# Patient Record
Sex: Female | Born: 1985
Health system: Southern US, Community
[De-identification: ages and names within clinical notes are randomized; demographics above are authoritative.]

## PROBLEM LIST (undated history)

## (undated) DIAGNOSIS — Z789 Other specified health status: Secondary | ICD-10-CM

## (undated) HISTORY — PX: WISDOM TOOTH EXTRACTION: SHX21

---

## 2016-07-20 DIAGNOSIS — H5203 Hypermetropia, bilateral: Secondary | ICD-10-CM | POA: Diagnosis not present

## 2016-10-18 ENCOUNTER — Other Ambulatory Visit (HOSPITAL_COMMUNITY)
Admission: RE | Admit: 2016-10-18 | Discharge: 2016-10-18 | Disposition: A | Discharge status: Home / Self Care | Payer: 59 | Source: Ambulatory Visit | Attending: Family Medicine | Admitting: Family Medicine

## 2016-10-18 ENCOUNTER — Other Ambulatory Visit: Payer: Self-pay | Admitting: Family Medicine

## 2016-10-18 DIAGNOSIS — Z1151 Encounter for screening for human papillomavirus (HPV): Secondary | ICD-10-CM | POA: Insufficient documentation

## 2016-10-18 DIAGNOSIS — Z131 Encounter for screening for diabetes mellitus: Secondary | ICD-10-CM | POA: Diagnosis not present

## 2016-10-18 DIAGNOSIS — Z0000 Encounter for general adult medical examination without abnormal findings: Secondary | ICD-10-CM | POA: Diagnosis not present

## 2016-10-18 DIAGNOSIS — Z124 Encounter for screening for malignant neoplasm of cervix: Secondary | ICD-10-CM | POA: Insufficient documentation

## 2016-10-18 DIAGNOSIS — Z13220 Encounter for screening for lipoid disorders: Secondary | ICD-10-CM | POA: Diagnosis not present

## 2016-10-25 LAB — CYTOLOGY - PAP
DIAGNOSIS: NEGATIVE
HPV (WINDOPATH): NOT DETECTED

## 2017-02-07 MED FILL — AZITHROMYCIN 500 MG TABLET: 500 | 3 days supply | Qty: 3 | Fill #0

## 2017-02-07 MED FILL — ATOVAQUONE-PROGUANIL 250-10: 250-100 | 15 days supply | Qty: 15 | Fill #0

## 2017-03-03 DIAGNOSIS — Z30432 Encounter for removal of intrauterine contraceptive device: Secondary | ICD-10-CM | POA: Diagnosis not present

## 2017-03-03 DIAGNOSIS — Z3009 Encounter for other general counseling and advice on contraception: Secondary | ICD-10-CM | POA: Diagnosis not present

## 2017-06-26 DIAGNOSIS — H5203 Hypermetropia, bilateral: Secondary | ICD-10-CM | POA: Diagnosis not present

## 2017-10-13 DIAGNOSIS — Z3401 Encounter for supervision of normal first pregnancy, first trimester: Secondary | ICD-10-CM | POA: Diagnosis not present

## 2017-10-13 DIAGNOSIS — Z3400 Encounter for supervision of normal first pregnancy, unspecified trimester: Secondary | ICD-10-CM | POA: Diagnosis not present

## 2017-10-16 LAB — OB RESULTS CONSOLE HIV ANTIBODY (ROUTINE TESTING): HIV: NONREACTIVE

## 2017-10-16 LAB — OB RESULTS CONSOLE GC/CHLAMYDIA
Chlamydia: NEGATIVE
Gonorrhea: NEGATIVE

## 2017-10-16 LAB — OB RESULTS CONSOLE HEPATITIS B SURFACE ANTIGEN: Hepatitis B Surface Ag: NEGATIVE

## 2017-10-16 LAB — OB RESULTS CONSOLE ABO/RH: RH Type: POSITIVE

## 2017-10-16 LAB — OB RESULTS CONSOLE ANTIBODY SCREEN: ANTIBODY SCREEN: NEGATIVE

## 2017-10-16 LAB — OB RESULTS CONSOLE RPR: RPR: NONREACTIVE

## 2017-10-16 LAB — OB RESULTS CONSOLE RUBELLA ANTIBODY, IGM: RUBELLA: IMMUNE

## 2017-11-23 DIAGNOSIS — Z3402 Encounter for supervision of normal first pregnancy, second trimester: Secondary | ICD-10-CM | POA: Diagnosis not present

## 2018-01-01 DIAGNOSIS — Z3400 Encounter for supervision of normal first pregnancy, unspecified trimester: Secondary | ICD-10-CM | POA: Diagnosis not present

## 2018-01-01 DIAGNOSIS — Z3402 Encounter for supervision of normal first pregnancy, second trimester: Secondary | ICD-10-CM | POA: Diagnosis not present

## 2018-01-04 ENCOUNTER — Other Ambulatory Visit (HOSPITAL_COMMUNITY): Payer: Self-pay | Admitting: Obstetrics & Gynecology

## 2018-01-04 DIAGNOSIS — Z3A19 19 weeks gestation of pregnancy: Secondary | ICD-10-CM

## 2018-01-04 DIAGNOSIS — O280 Abnormal hematological finding on antenatal screening of mother: Secondary | ICD-10-CM

## 2018-01-04 DIAGNOSIS — Z3689 Encounter for other specified antenatal screening: Secondary | ICD-10-CM

## 2018-01-09 ENCOUNTER — Encounter (HOSPITAL_COMMUNITY): Payer: Self-pay | Admitting: *Deleted

## 2018-01-10 ENCOUNTER — Encounter (HOSPITAL_COMMUNITY): Payer: Self-pay | Admitting: *Deleted

## 2018-01-11 ENCOUNTER — Ambulatory Visit (HOSPITAL_COMMUNITY)
Admission: RE | Admit: 2018-01-11 | Discharge: 2018-01-11 | Disposition: A | Discharge status: Home / Self Care | Payer: 59 | Source: Ambulatory Visit | Attending: Obstetrics & Gynecology | Admitting: Obstetrics & Gynecology

## 2018-01-11 ENCOUNTER — Encounter (HOSPITAL_COMMUNITY): Payer: Self-pay

## 2018-01-11 ENCOUNTER — Other Ambulatory Visit (HOSPITAL_COMMUNITY): Payer: Self-pay | Admitting: *Deleted

## 2018-01-11 DIAGNOSIS — O280 Abnormal hematological finding on antenatal screening of mother: Secondary | ICD-10-CM

## 2018-01-11 DIAGNOSIS — Z315 Encounter for genetic counseling: Secondary | ICD-10-CM | POA: Insufficient documentation

## 2018-01-11 DIAGNOSIS — Z3A19 19 weeks gestation of pregnancy: Secondary | ICD-10-CM | POA: Diagnosis not present

## 2018-01-11 DIAGNOSIS — O285 Abnormal chromosomal and genetic finding on antenatal screening of mother: Secondary | ICD-10-CM | POA: Diagnosis not present

## 2018-01-11 DIAGNOSIS — O289 Unspecified abnormal findings on antenatal screening of mother: Secondary | ICD-10-CM | POA: Insufficient documentation

## 2018-01-11 DIAGNOSIS — Z3689 Encounter for other specified antenatal screening: Secondary | ICD-10-CM | POA: Insufficient documentation

## 2018-01-11 DIAGNOSIS — IMO0001 Reserved for inherently not codable concepts without codable children: Secondary | ICD-10-CM

## 2018-01-11 HISTORY — DX: Other specified health status: Z78.9

## 2018-01-11 IMAGING — US US MFM OB DETAIL+14 WK
1 series · 14 of 28 positions shown · non-contrast
Comparison: none

[Series 1: us mfm ob detail+14 wk · 14 of 91 slices shown]
[im 4/91]
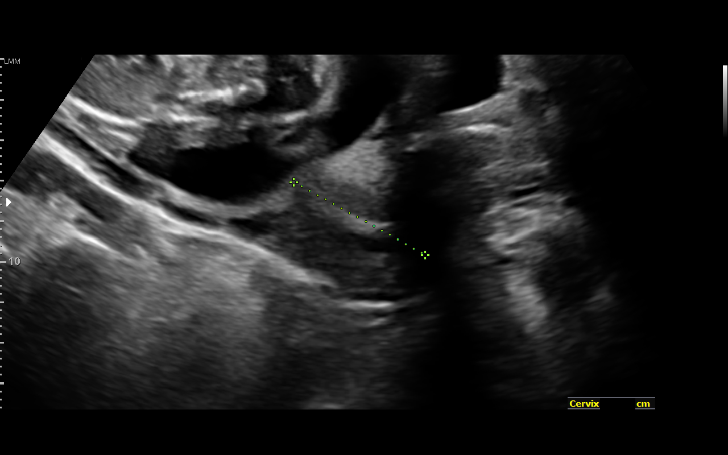
[im 11/91]
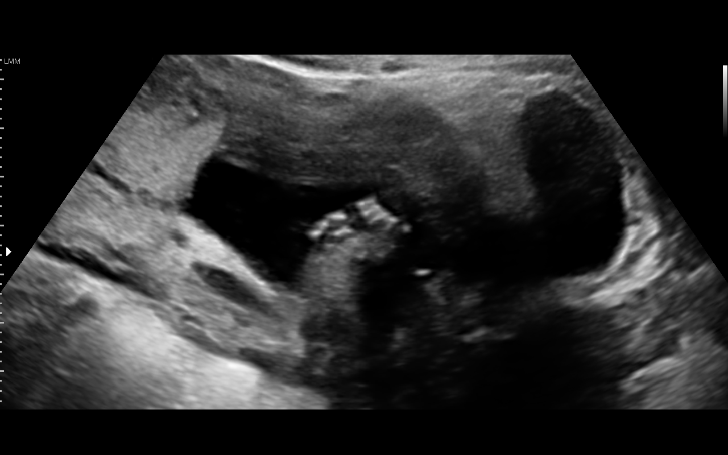
[im 17/91]
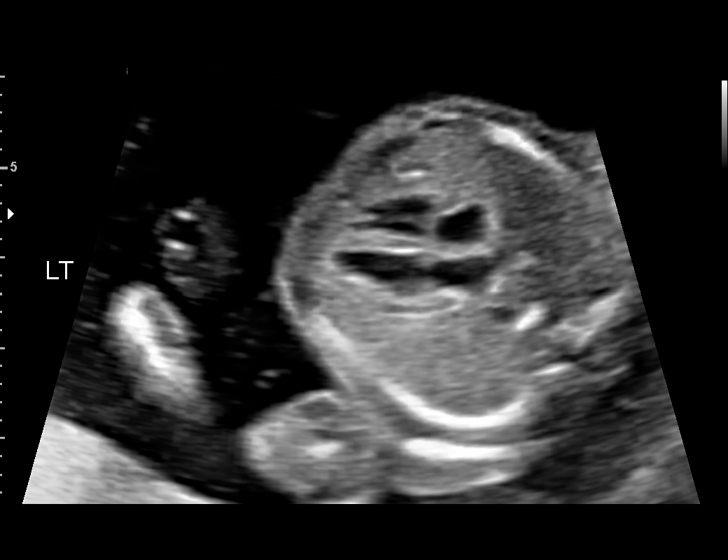
[im 24/91]
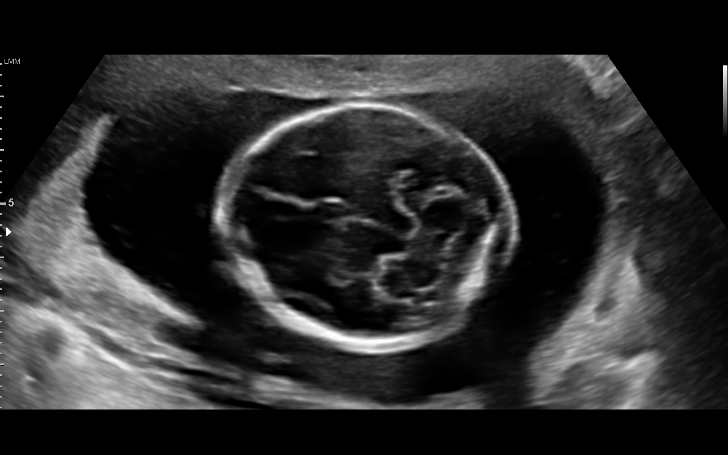
[im 31/91]
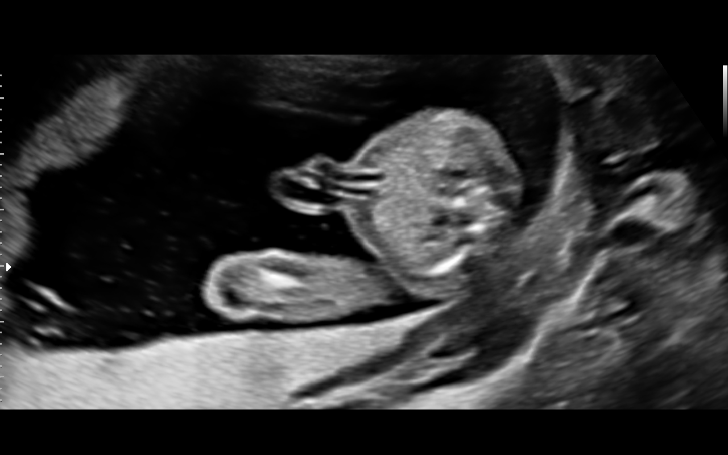
[im 37/91]
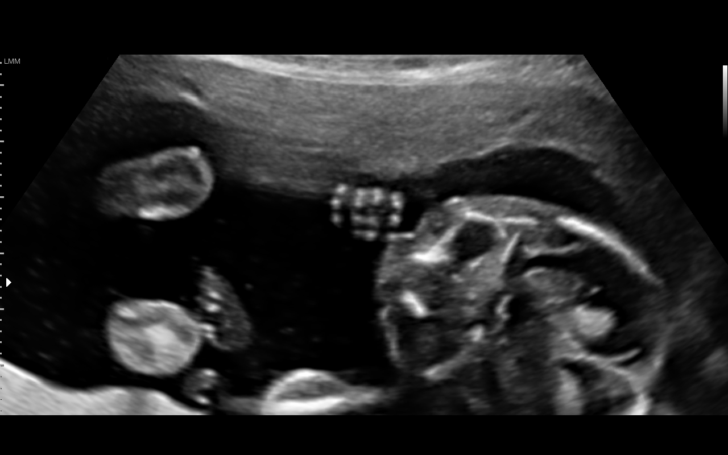
[im 44/91]
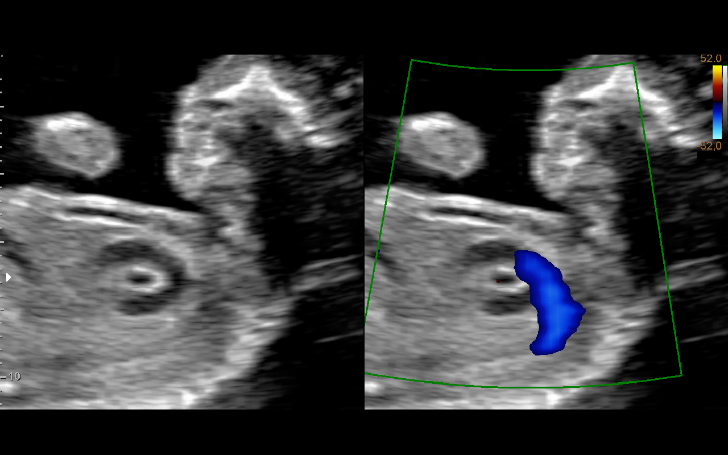
[im 51/91]
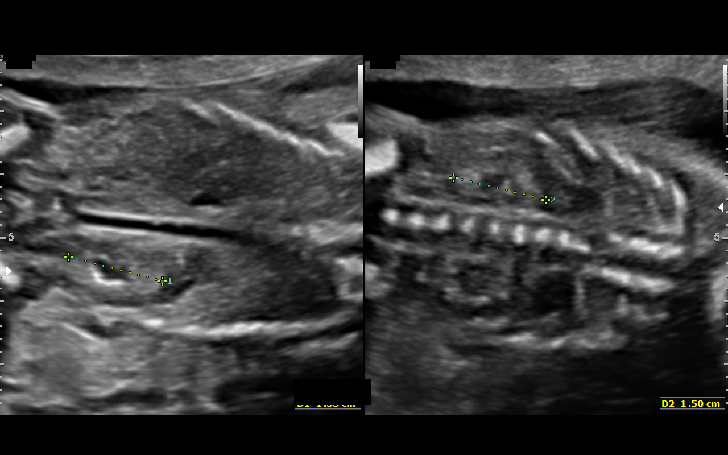
[im 57/91]
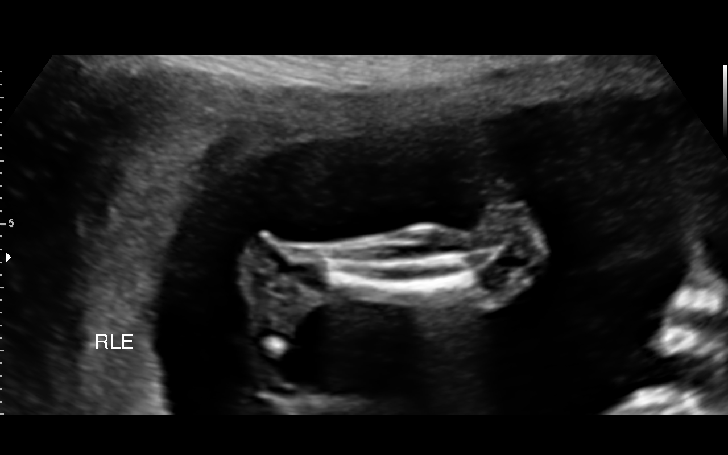
[im 64/91]
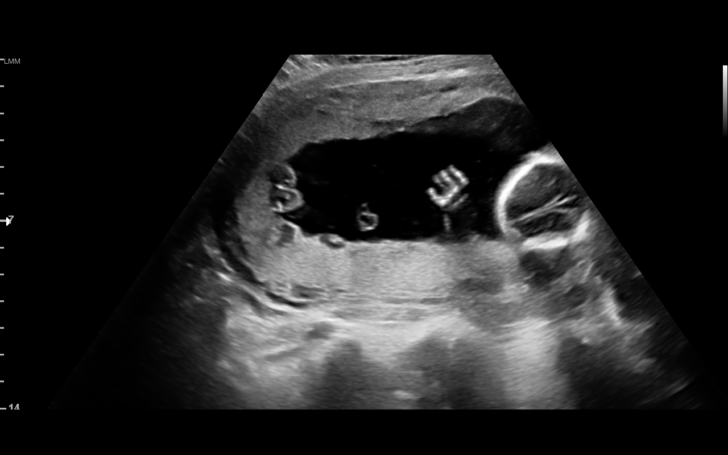
[im 71/91]
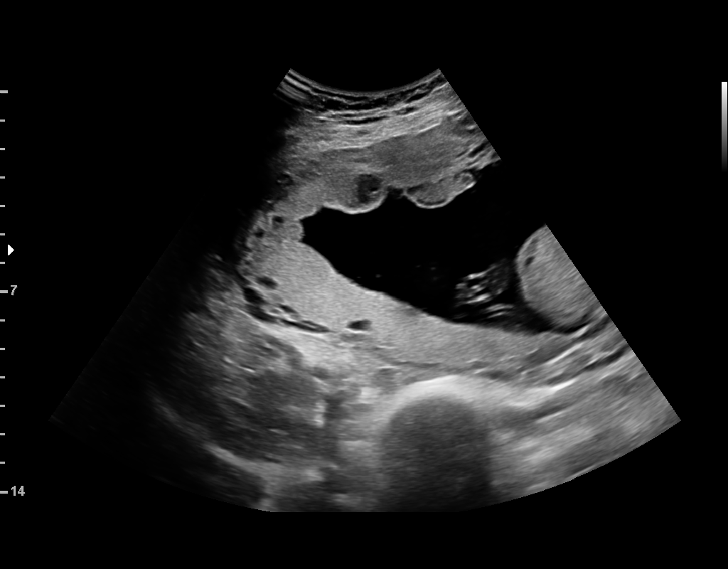
[im 77/91]
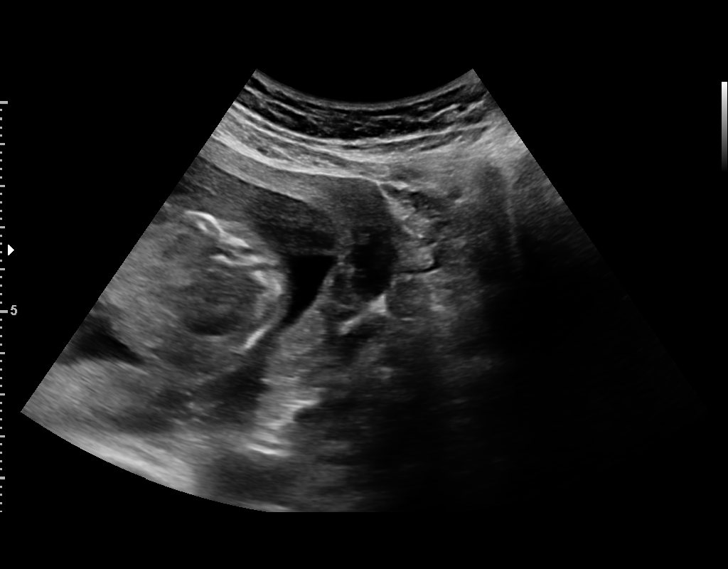
[im 84/91]
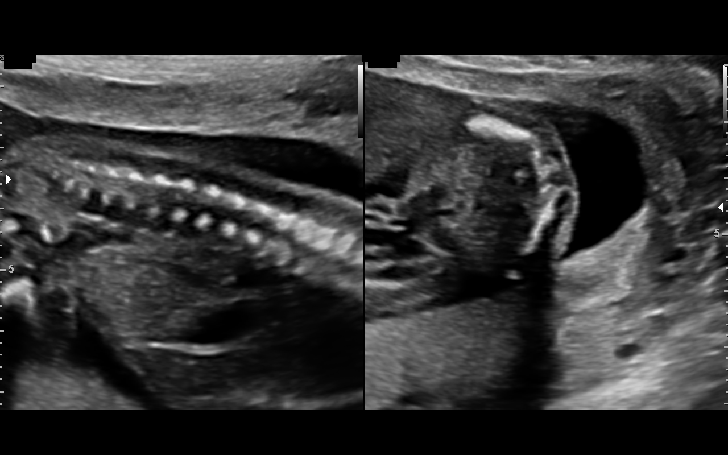
[im 91/91]
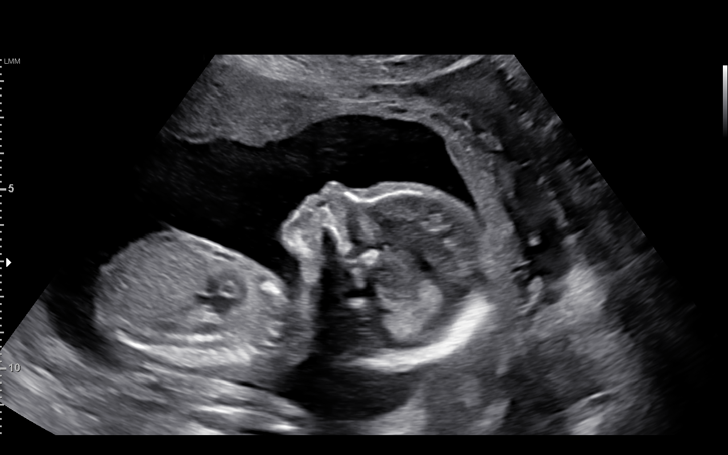

[14 of 28 positions shown; findings below may reference images not displayed]

Indications

19 weeks gestation of pregnancy
Encounter for fetal anatomic survey            [BJ]
Abnormal biochemical screen (quad) for         [BJ]
Trisomy 21 ([DATE])
OB History

Gravidity:    1
Fetal Evaluation

Num Of Fetuses:     1
Fetal Heart         155
Rate(bpm):
Cardiac Activity:   Observed
Presentation:       Cephalic
Placenta:           Posterior Fundal right,above cervical os
P. Cord Insertion:  Visualized, central

Amniotic Fluid
AFI FV:      Subjectively within normal limits

Largest Pocket(cm)
4.82
Biometry

BPD:      43.6  mm     G. Age:  19w 1d         59  %    CI:        80.87   %    70 - 86
FL/HC:      16.9   %    16.1 -
HC:      153.1  mm     G. Age:  18w 2d         14  %    HC/AC:      1.13        1.09 -
AC:      135.7  mm     G. Age:  19w 0d         47  %    FL/BPD:     59.2   %
FL:       25.8  mm     G. Age:  17w 6d         10  %    FL/AC:      19.0   %    20 - 24
CER:      18.2  mm     G. Age:  18w 1d         23  %
NFT:       3.9  mm

CM:        4.1  mm

Est. FW:     241  gm      0 lb 9 oz     34  %
Gestational Age

LMP:           19w 0d        Date:  [DATE]                 EDD:   [DATE]
U/S Today:     18w 4d                                        EDD:   [DATE]
Best:          19w 0d     Det. By:  LMP  ([DATE])          EDD:   [DATE]
Anatomy

Cranium:               Appears normal         LVOT:                   Appears normal
Cavum:                 Appears normal         Aortic Arch:            Appears normal
Ventricles:            Appears normal         Ductal Arch:            Appears normal
Choroid Plexus:        Left choroid           Diaphragm:              Appears normal
plexus cyst, 3.2mm
Cerebellum:            Appears normal         Stomach:                Appears normal, left
sided
Posterior Fossa:       Appears normal         Abdomen:                Appears normal
Nuchal Fold:           Appears normal         Abdominal Wall:         Appears nml (cord
insert, abd wall)
Face:                  Appears normal         Cord Vessels:           Appears normal (3
(orbits and profile)                           vessel cord)
Lips:                  Appears normal         Kidneys:                Appear normal
Palate:                Appears normal         Bladder:                Appears normal
Thoracic:              Appears normal         Spine:                  Appears normal
Heart:                 Appears normal         Upper Extremities:      Appears normal
(4CH, axis, and situs
RVOT:                  Appears normal         Lower Extremities:      Appears normal

Other:  Fetus appears to be a female. Heels and 5th digit appears normal.
Nasal bone visualized.
Cervix Uterus Adnexa

Cervix
Length:           3.69  cm.
Normal appearance by transabdominal scan.

Uterus
No abnormality visualized.

Left Ovary
Not visualized.

Right Ovary
Not visualized.

Cul De Sac:   No free fluid seen.

Adnexa:       No abnormality visualized.
Impression

Singleton intrauterine pregnancy at weeks 19+0 with
abnormal quad screen here for anatomic survey
Review of the anatomy shows a left choroid plexus cyst.
Otherwise no sonographic markers for aneuploidy or
structural anomalies are seen
All relevant fetal anatomy has been visualized
Amniotic fluid volume is normal
Estimated fetal weight shows growth in the 34th percentile
Recommendations

Recommend follow-up ultrasound examination in 4 weeks for
CPC follow up
For genetic counseling today
Would consider repeat evaluation of growth in third trimester
due to elevated HCG MoM

## 2018-01-11 NOTE — Progress Notes (Signed)
Genetic Counseling  High-Risk Gestation Note  Appointment Date:  01/11/2018 Referred By: Carolyn Garner, [name], DO Date of Birth:  05/03/1986 Partner:  Carolyn Garner   Pregnancy History: G1P0 Estimated Date of Delivery: 06/07/18 Estimated Gestational Age: 6776w0d Attending: Charlsie MerlesMark Newman, MD   Carolyn Garner and her husband, Carolyn Garner, were seen for genetic counseling because of an increased risk for fetal Down syndrome based on Quad screen through [facility].  In summary:  Reviewed results of Quad screening test  Increased risk for Down syndrome (1 in 104)  Elevated hCG (3.09 MoM)   Discussed additional screening options  NIPS- elected to pursue Panorama today  Ultrasound- performed today; see separate report  Follow-up ultrasound scheduled in 4 weeks  Discussed diagnostic testing options  Amniocentesis-declined  Reviewed family history concerns  Discussed general population carrier screening options- declined  CF  SMA  Hemoglobinopathies  They were counseled regarding the screening result and the associated 1 in 104 risk for fetal Down syndrome.  We reviewed chromosomes, nondisjunction, and the common features and variable prognosis of Down syndrome.  In addition, we reviewed the screen negative risks for trisomy 18 and open neural tube defects.  We also discussed other explanations for a screen positive result including: a gestational dating error, differences in maternal metabolism, and normal variation. We specifically discussed that the level of one of the proteins analyzed on the screen, hCG, was very high (3.09 MoM).  This has been associated with an increased risk for growth restriction, preeclampsia, or poor pregnancy outcome later in pregnancy; therefore, we would recommend a follow up ultrasound for fetal growth in the third trimester.  We reviewed other available screening options including noninvasive prenatal screening (NIPS)/cell free DNA (cfDNA)  screening, and detailed ultrasound.  They were counseled that screening tests are used to modify a patient's a priori risk for aneuploidy, typically based on age. This estimate provides a pregnancy specific risk assessment. We reviewed the benefits and limitations of each option. Specifically, we discussed the conditions for which each test screens, the detection rates, and false positive rates of each. They were also counseled regarding diagnostic testing via amniocentesis. We reviewed the approximate 1 in 300-500 risk for complications from amniocentesis, including spontaneous pregnancy loss. We discussed the possible results that the tests might provide including: positive, negative, unanticipated, and no result.   Detailed ultrasound was performed today. Apparently isolated unilateral choroid plexus cyst was visualized. The ultrasound report will be sent under separate cover. There were no visualized fetal anomalies or additional markers of aneuploidy. The choroid plexus is an area in the brain where cerebral spinal fluid is made.  Cysts, or fluid filled sacs, are sometimes found in the choroid plexus of babies both before and after they are born.  We discussed that approximately 1% of pregnancies evaluated by ultrasound will show choroid plexus cyst (CPCs).  Literature suggests that CPCs are an ultrasound finding in approximately 30-50% of fetuses with trisomy 18, but are an isolated finding in less than 10% of fetuses with trisomy 5818.  Dr. Ruby ColaJennifer Garner was counseled that when a patient has other risk factors for fetal trisomy 18 (abnormal First trimester or quad screening, advanced maternal age, or another ultrasound finding), CPCs are associated with an increased risk (LR of 9) for trisomy 2718.  Newer literature suggests that in the absence of other risk factors, CPCs are likely a normal variation of development or a benign finding.  CPCs are not associated with an increased risk for fetal  Down  syndrome. We discussed that given the patient's age. Quad screen results for Trisomy 18, and today's ultrasound, the CPC would not be expected to significantly alter the risk for fetal Trisomy 18 in the current pregnancy.     After consideration of all the options, they elected to proceed with NIPS (Panorama through [facility] laboratory) today.  Those results will be available in 8-10 days.  They declined amniocentesis at this time. They understand that screening and ultrasound cannot diagnose or rule out all genetic conditions or birth defects.   Dr. Ruby Garner was provided with written information regarding cystic fibrosis (CF), spinal muscular atrophy (SMA) and hemoglobinopathies including the carrier frequency, availability of carrier screening and prenatal diagnosis if indicated.  In addition, we discussed that CF and hemoglobinopathies are routinely screened for as part of the [place] newborn screening panel and that SMA is available on newborn screening under a research protocol in [place] for individuals who enroll in the Early Check program.  After further discussion, she declined screening for CF, SMA and hemoglobinopathies.   Both family histories were reviewed and found to be noncontributory for birth defects, intellectual disability, recurrent pregnancy loss, and known genetic conditions. The father of the pregnancy did not have information regarding his biological family history given that he was adopted. The couple reported [place] ancestry and no known consanguinity.  Without further information regarding the provided family history, an accurate genetic risk cannot be calculated. Further genetic counseling is warranted if more information is obtained.  Dr. Ruby Garner denied exposure to environmental toxins or chemical agents. She denied the use of alcohol, tobacco or street drugs. She denied significant viral illnesses during the course of her pregnancy.   I counseled this  couple for approximately 40 minutes regarding the above risks and available options.   Carolyn Plowman, MS,  Certified Genetic Counselor 01/11/2018

## 2018-01-18 ENCOUNTER — Telehealth (HOSPITAL_COMMUNITY): Payer: Self-pay | Admitting: MS"

## 2018-01-18 NOTE — Telephone Encounter (Signed)
Called Carolyn Garner to discuss her prenatal cell free DNA test results.  Dr. Ruby ColaJennifer Garner had [facility].  Testing was offered because of abnormal maternal serum screening.   The patient was identified by name and DOB.  We reviewed that these are within normal limits, showing a less than 1 in 10,000 risk for trisomies 21, 18 and 13, and monosomy X (Turner syndrome).  In addition, the risk for triploidy and sex chromosome trisomies (47,XXX and 47,XXY) was also low risk.  We reviewed that this testing identifies > 99% of pregnancies with trisomy 6021, trisomy 9113, sex chromosome trisomies (47,XXX and 47,XXY), and triploidy. The detection rate for trisomy 18 is 96%.  The detection rate for monosomy X is ~92%.  The false positive rate is <0.1% for all conditions. Testing was also consistent with female fetal sex.  The patient did wish to know fetal sex.  She understands that this testing does not identify all genetic conditions.  All questions were answered to her satisfaction, she was encouraged to call with additional questions or concerns.  Carolyn PlowmanKaren Jaymie Misch, MS Certified Genetic Counselor 01/18/2018 1:49 PM

## 2018-01-22 ENCOUNTER — Other Ambulatory Visit (HOSPITAL_COMMUNITY): Payer: Self-pay

## 2018-01-23 ENCOUNTER — Other Ambulatory Visit: Payer: Self-pay

## 2018-01-31 MED FILL — NITROFURANTOIN MONO-MCR 100: 100 | 5 days supply | Qty: 10 | Fill #0

## 2018-02-06 ENCOUNTER — Encounter (HOSPITAL_COMMUNITY): Payer: Self-pay

## 2018-02-06 ENCOUNTER — Ambulatory Visit (HOSPITAL_COMMUNITY): Payer: 59

## 2018-03-01 DIAGNOSIS — Z3402 Encounter for supervision of normal first pregnancy, second trimester: Secondary | ICD-10-CM | POA: Diagnosis not present

## 2018-03-23 DIAGNOSIS — O9989 Other specified diseases and conditions complicating pregnancy, childbirth and the puerperium: Secondary | ICD-10-CM | POA: Diagnosis not present

## 2018-03-29 DIAGNOSIS — Z23 Encounter for immunization: Secondary | ICD-10-CM | POA: Diagnosis not present

## 2018-03-29 DIAGNOSIS — Z3403 Encounter for supervision of normal first pregnancy, third trimester: Secondary | ICD-10-CM | POA: Diagnosis not present

## 2018-04-26 DIAGNOSIS — Z3483 Encounter for supervision of other normal pregnancy, third trimester: Secondary | ICD-10-CM | POA: Diagnosis not present

## 2018-04-26 DIAGNOSIS — O289 Unspecified abnormal findings on antenatal screening of mother: Secondary | ICD-10-CM | POA: Diagnosis not present

## 2018-04-26 DIAGNOSIS — Z3403 Encounter for supervision of normal first pregnancy, third trimester: Secondary | ICD-10-CM | POA: Diagnosis not present

## 2018-04-26 DIAGNOSIS — Z3482 Encounter for supervision of other normal pregnancy, second trimester: Secondary | ICD-10-CM | POA: Diagnosis not present

## 2018-05-09 DIAGNOSIS — Z3403 Encounter for supervision of normal first pregnancy, third trimester: Secondary | ICD-10-CM | POA: Diagnosis not present

## 2018-06-01 ENCOUNTER — Encounter (HOSPITAL_COMMUNITY): Payer: Self-pay | Admitting: *Deleted

## 2018-06-01 ENCOUNTER — Telehealth (HOSPITAL_COMMUNITY): Payer: Self-pay | Admitting: *Deleted

## 2018-06-01 NOTE — Telephone Encounter (Signed)
Preadmission screen  

## 2018-06-06 ENCOUNTER — Encounter (HOSPITAL_COMMUNITY)
Admission: AD | Disposition: A | Discharge status: Home / Self Care | Payer: Self-pay | Source: Home / Self Care | Attending: Obstetrics & Gynecology

## 2018-06-06 ENCOUNTER — Inpatient Hospital Stay (HOSPITAL_COMMUNITY): Payer: 59 | Admitting: Anesthesiology

## 2018-06-06 ENCOUNTER — Encounter (HOSPITAL_COMMUNITY): Payer: Self-pay | Admitting: *Deleted

## 2018-06-06 ENCOUNTER — Inpatient Hospital Stay (HOSPITAL_COMMUNITY)
Admission: AD | Admit: 2018-06-06 | Discharge: 2018-06-09 | DRG: 787 | Disposition: A | Discharge status: Home / Self Care | Payer: 59 | Attending: Obstetrics & Gynecology | Admitting: Obstetrics & Gynecology

## 2018-06-06 DIAGNOSIS — O9081 Anemia of the puerperium: Secondary | ICD-10-CM | POA: Diagnosis not present

## 2018-06-06 DIAGNOSIS — O324XX0 Maternal care for high head at term, not applicable or unspecified: Secondary | ICD-10-CM | POA: Diagnosis present

## 2018-06-06 DIAGNOSIS — O358XX0 Maternal care for other (suspected) fetal abnormality and damage, not applicable or unspecified: Secondary | ICD-10-CM | POA: Diagnosis present

## 2018-06-06 DIAGNOSIS — Z3A39 39 weeks gestation of pregnancy: Secondary | ICD-10-CM | POA: Diagnosis not present

## 2018-06-06 DIAGNOSIS — O34219 Maternal care for unspecified type scar from previous cesarean delivery: Secondary | ICD-10-CM

## 2018-06-06 DIAGNOSIS — Z3483 Encounter for supervision of other normal pregnancy, third trimester: Secondary | ICD-10-CM | POA: Diagnosis present

## 2018-06-06 LAB — TYPE AND SCREEN
ABO/RH(D): AB POS
ANTIBODY SCREEN: NEGATIVE

## 2018-06-06 LAB — CBC
HEMATOCRIT: 40 % (ref 36.0–46.0)
HEMOGLOBIN: 13.9 g/dL (ref 12.0–15.0)
MCH: 32 pg (ref 26.0–34.0)
MCHC: 34.8 g/dL (ref 30.0–36.0)
MCV: 92.2 fL (ref 78.0–100.0)
Platelets: 208 10*3/uL (ref 150–400)
RBC: 4.34 MIL/uL (ref 3.87–5.11)
RDW: 12.9 % (ref 11.5–15.5)
WBC: 10.8 10*3/uL — ABNORMAL HIGH (ref 4.0–10.5)

## 2018-06-06 LAB — RPR: RPR Ser Ql: NONREACTIVE

## 2018-06-06 LAB — ABO/RH: ABO/RH(D): AB POS

## 2018-06-06 SURGERY — Surgical Case
Anesthesia: Epidural | Site: Abdomen | Wound class: Clean Contaminated

## 2018-06-06 MED ORDER — ONDANSETRON HCL 4 MG/2ML IJ SOLN
4.0000 mg | Freq: Four times a day (QID) | INTRAMUSCULAR | Status: DC | PRN
  Administered 2018-06-06: 4 mg via INTRAVENOUS
  Filled 2018-06-06: qty 2

## 2018-06-06 MED ORDER — PROMETHAZINE HCL 25 MG/ML IJ SOLN: 6.2500 mg | INTRAMUSCULAR | Status: DC | PRN

## 2018-06-06 MED ORDER — COCONUT OIL OIL
1.0000 "application " | TOPICAL_OIL | Status: DC | PRN
  Administered 2018-06-08: 1 via TOPICAL
  Filled 2018-06-06: qty 120

## 2018-06-06 MED ORDER — OXYTOCIN 40 UNITS IN LACTATED RINGERS INFUSION - SIMPLE MED
INTRAVENOUS | Status: AC
  Filled 2018-06-06: qty 1000

## 2018-06-06 MED ORDER — NALOXONE HCL 4 MG/10ML IJ SOLN: 1.0000 ug/kg/h | INTRAVENOUS | Status: DC | PRN

## 2018-06-06 MED ORDER — SODIUM CHLORIDE 0.9 % IV SOLN
500.0000 mg | Freq: Once | INTRAVENOUS | Status: AC
  Administered 2018-06-06: 500 mg via INTRAVENOUS
  Filled 2018-06-06: qty 500

## 2018-06-06 MED ORDER — PHENYLEPHRINE 40 MCG/ML (10ML) SYRINGE FOR IV PUSH (FOR BLOOD PRESSURE SUPPORT): 80.0000 ug | PREFILLED_SYRINGE | INTRAVENOUS | Status: DC | PRN

## 2018-06-06 MED ORDER — DIPHENHYDRAMINE HCL 50 MG/ML IJ SOLN: 12.5000 mg | INTRAMUSCULAR | Status: DC | PRN

## 2018-06-06 MED ORDER — ONDANSETRON HCL 4 MG/2ML IJ SOLN: 4.0000 mg | Freq: Three times a day (TID) | INTRAMUSCULAR | Status: DC | PRN

## 2018-06-06 MED ORDER — FENTANYL CITRATE (PF) 100 MCG/2ML IJ SOLN: 50.0000 ug | INTRAMUSCULAR | Status: DC | PRN

## 2018-06-06 MED ORDER — ONDANSETRON HCL 4 MG/2ML IJ SOLN
INTRAMUSCULAR | Status: DC | PRN
  Administered 2018-06-06: 4 mg via INTRAVENOUS

## 2018-06-06 MED ORDER — SODIUM CHLORIDE 0.9% FLUSH: 3.0000 mL | INTRAVENOUS | Status: DC | PRN

## 2018-06-06 MED ORDER — LACTATED RINGERS IV SOLN
500.0000 mL | Freq: Once | INTRAVENOUS | Status: AC
  Administered 2018-06-06: 500 mL via INTRAVENOUS

## 2018-06-06 MED ORDER — OXYCODONE HCL 5 MG/5ML PO SOLN: 5.0000 mg | Freq: Once | ORAL | Status: DC | PRN

## 2018-06-06 MED ORDER — FENTANYL CITRATE (PF) 100 MCG/2ML IJ SOLN
INTRAMUSCULAR | Status: AC
  Filled 2018-06-06: qty 2

## 2018-06-06 MED ORDER — ACETAMINOPHEN 325 MG PO TABS: 650.0000 mg | ORAL_TABLET | ORAL | Status: DC | PRN

## 2018-06-06 MED ORDER — EPHEDRINE 5 MG/ML INJ: 10.0000 mg | INTRAVENOUS | Status: DC | PRN

## 2018-06-06 MED ORDER — ONDANSETRON HCL 4 MG/2ML IJ SOLN
INTRAMUSCULAR | Status: AC
  Filled 2018-06-06: qty 2

## 2018-06-06 MED ORDER — METOCLOPRAMIDE HCL 5 MG/ML IJ SOLN
INTRAMUSCULAR | Status: DC | PRN
  Administered 2018-06-06: 5 mg via INTRAVENOUS

## 2018-06-06 MED ORDER — OXYTOCIN 40 UNITS IN LACTATED RINGERS INFUSION - SIMPLE MED: 2.5000 [IU]/h | INTRAVENOUS | Status: AC

## 2018-06-06 MED ORDER — LIDOCAINE HCL (PF) 1 % IJ SOLN: 30.0000 mL | INTRAMUSCULAR | Status: DC | PRN

## 2018-06-06 MED ORDER — KETOROLAC TROMETHAMINE 30 MG/ML IJ SOLN
30.0000 mg | Freq: Once | INTRAMUSCULAR | Status: AC | PRN
  Administered 2018-06-06: 30 mg via INTRAVENOUS

## 2018-06-06 MED ORDER — LACTATED RINGERS IV SOLN
INTRAVENOUS | Status: DC
  Administered 2018-06-06 (×2): via INTRAVENOUS

## 2018-06-06 MED ORDER — PHENYLEPHRINE 40 MCG/ML (10ML) SYRINGE FOR IV PUSH (FOR BLOOD PRESSURE SUPPORT)
PREFILLED_SYRINGE | INTRAVENOUS | Status: AC
  Filled 2018-06-06: qty 20

## 2018-06-06 MED ORDER — NALBUPHINE HCL 10 MG/ML IJ SOLN: 5.0000 mg | Freq: Once | INTRAMUSCULAR | Status: DC | PRN

## 2018-06-06 MED ORDER — OXYCODONE-ACETAMINOPHEN 5-325 MG PO TABS: 2.0000 | ORAL_TABLET | ORAL | Status: DC | PRN

## 2018-06-06 MED ORDER — LACTATED RINGERS IV SOLN
INTRAVENOUS | Status: DC
  Administered 2018-06-06: 18:00:00 via INTRAVENOUS

## 2018-06-06 MED ORDER — NALBUPHINE HCL 10 MG/ML IJ SOLN: 5.0000 mg | INTRAMUSCULAR | Status: DC | PRN

## 2018-06-06 MED ORDER — ZOLPIDEM TARTRATE 5 MG PO TABS: 5.0000 mg | ORAL_TABLET | Freq: Every evening | ORAL | Status: DC | PRN

## 2018-06-06 MED ORDER — FENTANYL 2.5 MCG/ML BUPIVACAINE 1/10 % EPIDURAL INFUSION (WH - ANES)
INTRAMUSCULAR | Status: AC
  Filled 2018-06-06: qty 100

## 2018-06-06 MED ORDER — OXYCODONE HCL 5 MG PO TABS
5.0000 mg | ORAL_TABLET | ORAL | Status: DC | PRN
  Administered 2018-06-08: 5 mg via ORAL

## 2018-06-06 MED ORDER — MEPERIDINE HCL 25 MG/ML IJ SOLN
INTRAMUSCULAR | Status: DC | PRN
  Administered 2018-06-06 (×2): 12.5 mg via INTRAVENOUS

## 2018-06-06 MED ORDER — LACTATED RINGERS IV SOLN: 500.0000 mL | Freq: Once | INTRAVENOUS | Status: DC

## 2018-06-06 MED ORDER — LIDOCAINE HCL (PF) 1 % IJ SOLN
INTRAMUSCULAR | Status: DC | PRN
  Administered 2018-06-06: 4 mL via EPIDURAL
  Administered 2018-06-06: 3 mL via EPIDURAL

## 2018-06-06 MED ORDER — MORPHINE SULFATE (PF) 0.5 MG/ML IJ SOLN
INTRAMUSCULAR | Status: AC
  Filled 2018-06-06: qty 10

## 2018-06-06 MED ORDER — SCOPOLAMINE 1 MG/3DAYS TD PT72
MEDICATED_PATCH | TRANSDERMAL | Status: DC | PRN
  Administered 2018-06-06: 1 via TRANSDERMAL

## 2018-06-06 MED ORDER — CEFAZOLIN SODIUM-DEXTROSE 2-4 GM/100ML-% IV SOLN
2.0000 g | Freq: Once | INTRAVENOUS | Status: AC
  Administered 2018-06-06: 2 g via INTRAVENOUS

## 2018-06-06 MED ORDER — MEPERIDINE HCL 25 MG/ML IJ SOLN: 6.2500 mg | INTRAMUSCULAR | Status: DC | PRN

## 2018-06-06 MED ORDER — DIPHENHYDRAMINE HCL 25 MG PO CAPS: 25.0000 mg | ORAL_CAPSULE | ORAL | Status: DC | PRN

## 2018-06-06 MED ORDER — DEXAMETHASONE SODIUM PHOSPHATE 10 MG/ML IJ SOLN
INTRAMUSCULAR | Status: AC
  Filled 2018-06-06: qty 1

## 2018-06-06 MED ORDER — METOCLOPRAMIDE HCL 5 MG/ML IJ SOLN
INTRAMUSCULAR | Status: AC
  Filled 2018-06-06: qty 2

## 2018-06-06 MED ORDER — PROMETHAZINE HCL 25 MG/ML IJ SOLN
6.2500 mg | Freq: Once | INTRAMUSCULAR | Status: AC
  Administered 2018-06-06: 6.25 mg via INTRAVENOUS
  Filled 2018-06-06: qty 1

## 2018-06-06 MED ORDER — OXYTOCIN BOLUS FROM INFUSION: 500.0000 mL | Freq: Once | INTRAVENOUS | Status: DC

## 2018-06-06 MED ORDER — OXYCODONE-ACETAMINOPHEN 5-325 MG PO TABS: 1.0000 | ORAL_TABLET | ORAL | Status: DC | PRN

## 2018-06-06 MED ORDER — WITCH HAZEL-GLYCERIN EX PADS: 1.0000 "application " | MEDICATED_PAD | CUTANEOUS | Status: DC | PRN

## 2018-06-06 MED ORDER — SCOPOLAMINE 1 MG/3DAYS TD PT72
MEDICATED_PATCH | TRANSDERMAL | Status: AC
  Filled 2018-06-06: qty 1

## 2018-06-06 MED ORDER — MORPHINE SULFATE (PF) 0.5 MG/ML IJ SOLN
INTRAMUSCULAR | Status: DC | PRN
  Administered 2018-06-06: 3 mg via EPIDURAL

## 2018-06-06 MED ORDER — SODIUM CHLORIDE 0.9 % IR SOLN
Status: DC | PRN
  Administered 2018-06-06: 1

## 2018-06-06 MED ORDER — KETOROLAC TROMETHAMINE 30 MG/ML IJ SOLN
INTRAMUSCULAR | Status: AC
  Filled 2018-06-06: qty 1

## 2018-06-06 MED ORDER — SIMETHICONE 80 MG PO CHEW: 80.0000 mg | CHEWABLE_TABLET | ORAL | Status: DC | PRN

## 2018-06-06 MED ORDER — HYDROMORPHONE HCL 1 MG/ML IJ SOLN: 0.2500 mg | INTRAMUSCULAR | Status: DC | PRN

## 2018-06-06 MED ORDER — NALOXONE HCL 0.4 MG/ML IJ SOLN: 0.4000 mg | INTRAMUSCULAR | Status: DC | PRN

## 2018-06-06 MED ORDER — SCOPOLAMINE 1 MG/3DAYS TD PT72: 1.0000 | MEDICATED_PATCH | Freq: Once | TRANSDERMAL | Status: DC

## 2018-06-06 MED ORDER — DIPHENHYDRAMINE HCL 25 MG PO CAPS: 25.0000 mg | ORAL_CAPSULE | Freq: Four times a day (QID) | ORAL | Status: DC | PRN

## 2018-06-06 MED ORDER — FENTANYL 2.5 MCG/ML BUPIVACAINE 1/10 % EPIDURAL INFUSION (WH - ANES)
14.0000 mL/h | INTRAMUSCULAR | Status: DC | PRN
  Administered 2018-06-06: 11 mL/h via EPIDURAL

## 2018-06-06 MED ORDER — DEXAMETHASONE SODIUM PHOSPHATE 4 MG/ML IJ SOLN
INTRAMUSCULAR | Status: DC | PRN
  Administered 2018-06-06: 4 mg via INTRAVENOUS

## 2018-06-06 MED ORDER — ACETAMINOPHEN 325 MG PO TABS
650.0000 mg | ORAL_TABLET | ORAL | Status: DC | PRN
  Administered 2018-06-07 – 2018-06-09 (×3): 650 mg via ORAL
  Filled 2018-06-06 (×3): qty 2

## 2018-06-06 MED ORDER — SENNOSIDES-DOCUSATE SODIUM 8.6-50 MG PO TABS
2.0000 | ORAL_TABLET | ORAL | Status: DC
  Administered 2018-06-06 – 2018-06-09 (×3): 2 via ORAL
  Filled 2018-06-06 (×3): qty 2

## 2018-06-06 MED ORDER — OXYCODONE HCL 5 MG PO TABS: 10.0000 mg | ORAL_TABLET | ORAL | Status: DC | PRN

## 2018-06-06 MED ORDER — OXYTOCIN 10 UNIT/ML IJ SOLN
INTRAVENOUS | Status: DC | PRN
  Administered 2018-06-06: 40 [IU] via INTRAVENOUS

## 2018-06-06 MED ORDER — SIMETHICONE 80 MG PO CHEW
80.0000 mg | CHEWABLE_TABLET | ORAL | Status: DC
  Administered 2018-06-06: 80 mg via ORAL
  Filled 2018-06-06 (×2): qty 1

## 2018-06-06 MED ORDER — MEPERIDINE HCL 25 MG/ML IJ SOLN
INTRAMUSCULAR | Status: AC
  Filled 2018-06-06: qty 1

## 2018-06-06 MED ORDER — OXYTOCIN 40 UNITS IN LACTATED RINGERS INFUSION - SIMPLE MED: 2.5000 [IU]/h | INTRAVENOUS | Status: DC

## 2018-06-06 MED ORDER — PRENATAL MULTIVITAMIN CH: 1.0000 | ORAL_TABLET | Freq: Every day | ORAL | Status: DC

## 2018-06-06 MED ORDER — OXYTOCIN 10 UNIT/ML IJ SOLN
INTRAMUSCULAR | Status: AC
  Filled 2018-06-06: qty 4

## 2018-06-06 MED ORDER — SODIUM BICARBONATE 8.4 % IV SOLN
INTRAVENOUS | Status: DC | PRN
  Administered 2018-06-06: 10 mL via EPIDURAL

## 2018-06-06 MED ORDER — KETOROLAC TROMETHAMINE 30 MG/ML IJ SOLN: 30.0000 mg | Freq: Once | INTRAMUSCULAR | Status: AC | PRN

## 2018-06-06 MED ORDER — SOD CITRATE-CITRIC ACID 500-334 MG/5ML PO SOLN
30.0000 mL | ORAL | Status: DC | PRN
  Administered 2018-06-06: 30 mL via ORAL
  Filled 2018-06-06: qty 15

## 2018-06-06 MED ORDER — IBUPROFEN 600 MG PO TABS
600.0000 mg | ORAL_TABLET | Freq: Four times a day (QID) | ORAL | Status: DC
  Administered 2018-06-06 – 2018-06-09 (×10): 600 mg via ORAL
  Filled 2018-06-06 (×10): qty 1

## 2018-06-06 MED ORDER — LIDOCAINE HCL (PF) 1 % IJ SOLN
INTRAMUSCULAR | Status: AC
  Filled 2018-06-06: qty 30

## 2018-06-06 MED ORDER — LACTATED RINGERS IV SOLN
INTRAVENOUS | Status: DC | PRN
  Administered 2018-06-06: 14:00:00 via INTRAVENOUS

## 2018-06-06 MED ORDER — DIBUCAINE 1 % RE OINT: 1.0000 "application " | TOPICAL_OINTMENT | RECTAL | Status: DC | PRN

## 2018-06-06 MED ORDER — OXYCODONE HCL 5 MG PO TABS
5.0000 mg | ORAL_TABLET | Freq: Once | ORAL | Status: DC | PRN
  Filled 2018-06-06: qty 1

## 2018-06-06 MED ORDER — LACTATED RINGERS IV SOLN
INTRAVENOUS | Status: DC
  Administered 2018-06-07: 02:00:00 via INTRAVENOUS

## 2018-06-06 MED ORDER — LACTATED RINGERS IV SOLN: 500.0000 mL | INTRAVENOUS | Status: DC | PRN

## 2018-06-06 MED ORDER — MENTHOL 3 MG MT LOZG: 1.0000 | LOZENGE | OROMUCOSAL | Status: DC | PRN

## 2018-06-06 MED ORDER — OXYCODONE HCL 5 MG PO TABS: 5.0000 mg | ORAL_TABLET | Freq: Once | ORAL | Status: DC | PRN

## 2018-06-06 MED ORDER — SIMETHICONE 80 MG PO CHEW
80.0000 mg | CHEWABLE_TABLET | Freq: Three times a day (TID) | ORAL | Status: DC
  Administered 2018-06-07 (×2): 80 mg via ORAL
  Filled 2018-06-06 (×6): qty 1

## 2018-06-06 SURGICAL SUPPLY — 37 items
BENZOIN TINCTURE PRP APPL 2/3 (GAUZE/BANDAGES/DRESSINGS) ×3 IMPLANT
CHLORAPREP W/TINT 26ML (MISCELLANEOUS) ×3 IMPLANT
CLAMP CORD UMBIL (MISCELLANEOUS) IMPLANT
CLOSURE STERI STRIP 1/2 X4 (GAUZE/BANDAGES/DRESSINGS) ×2 IMPLANT
CLOSURE WOUND 1/2 X4 (GAUZE/BANDAGES/DRESSINGS) ×1
CLOTH BEACON ORANGE TIMEOUT ST (SAFETY) ×3 IMPLANT
DERMABOND ADVANCED (GAUZE/BANDAGES/DRESSINGS)
DERMABOND ADVANCED .7 DNX12 (GAUZE/BANDAGES/DRESSINGS) IMPLANT
DRSG OPSITE POSTOP 4X10 (GAUZE/BANDAGES/DRESSINGS) ×3 IMPLANT
ELECT REM PT RETURN 9FT ADLT (ELECTROSURGICAL) ×3
ELECTRODE REM PT RTRN 9FT ADLT (ELECTROSURGICAL) ×1 IMPLANT
EXTRACTOR VACUUM KIWI (MISCELLANEOUS) IMPLANT
GLOVE BIOGEL PI IND STRL 7.0 (GLOVE) ×3 IMPLANT
GLOVE BIOGEL PI INDICATOR 7.0 (GLOVE) ×6
GLOVE ECLIPSE 6.5 STRL STRAW (GLOVE) ×3 IMPLANT
GOWN STRL REUS W/TWL LRG LVL3 (GOWN DISPOSABLE) ×9 IMPLANT
HEMOSTAT ARISTA ABSORB 3G PWDR (MISCELLANEOUS) ×3 IMPLANT
KIT ABG SYR 3ML LUER SLIP (SYRINGE) IMPLANT
NEEDLE HYPO 25X5/8 SAFETYGLIDE (NEEDLE) IMPLANT
NS IRRIG 1000ML POUR BTL (IV SOLUTION) ×6 IMPLANT
PACK C SECTION WH (CUSTOM PROCEDURE TRAY) ×3 IMPLANT
PAD ABD 7.5X8 STRL (GAUZE/BANDAGES/DRESSINGS) ×3 IMPLANT
PAD OB MATERNITY 4.3X12.25 (PERSONAL CARE ITEMS) ×3 IMPLANT
PENCIL SMOKE EVAC W/HOLSTER (ELECTROSURGICAL) ×3 IMPLANT
RTRCTR C-SECT PINK 25CM LRG (MISCELLANEOUS) ×3 IMPLANT
STRIP CLOSURE SKIN 1/2X4 (GAUZE/BANDAGES/DRESSINGS) ×2 IMPLANT
SUT PLAIN 0 NONE (SUTURE) IMPLANT
SUT PLAIN 2 0 XLH (SUTURE) IMPLANT
SUT VIC AB 0 CT1 27 (SUTURE) ×4
SUT VIC AB 0 CT1 27XBRD ANBCTR (SUTURE) ×2 IMPLANT
SUT VIC AB 0 CTX 36 (SUTURE) ×6
SUT VIC AB 0 CTX36XBRD ANBCTRL (SUTURE) ×3 IMPLANT
SUT VIC AB 2-0 CT1 27 (SUTURE) ×2
SUT VIC AB 2-0 CT1 TAPERPNT 27 (SUTURE) ×1 IMPLANT
SUT VIC AB 4-0 KS 27 (SUTURE) ×3 IMPLANT
TOWEL OR 17X24 6PK STRL BLUE (TOWEL DISPOSABLE) ×3 IMPLANT
TRAY FOLEY W/BAG SLVR 14FR LF (SET/KITS/TRAYS/PACK) IMPLANT

## 2018-06-06 NOTE — Anesthesia Procedure Notes (Signed)
Epidural Patient location during procedure: OB Start time: 06/06/2018 4:41 AM End time: 06/06/2018 4:51 AM  Staffing Anesthesiologist: Mal Amabile, MD Performed: anesthesiologist   Preanesthetic Checklist Completed: patient identified, site marked, surgical consent, pre-op evaluation, timeout performed, IV checked, risks and benefits discussed and monitors and equipment checked  Epidural Patient position: sitting Prep: site prepped and draped and DuraPrep Patient monitoring: continuous pulse ox and blood pressure Approach: midline Location: L3-L4 Injection technique: LOR air  Needle:  Needle type: Tuohy  Needle gauge: 17 G Needle length: 9 cm and 9 Needle insertion depth: 4 cm Catheter type: closed end flexible Catheter size: 19 Gauge Catheter at skin depth: 9 cm Test dose: negative and Other  Assessment Events: blood not aspirated, injection not painful, no injection resistance, negative IV test and no paresthesia  Additional Notes Patient identified. Risks and benefits discussed including failed block, incomplete  Pain control, post dural puncture headache, nerve damage, paralysis, blood pressure Changes, nausea, vomiting, reactions to medications-both toxic and allergic and post Partum back pain. All questions were answered. Patient expressed understanding and wished to proceed. Sterile technique was used throughout procedure. Epidural site was Dressed with sterile barrier dressing. No paresthesias, signs of intravascular injection Or signs of intrathecal spread were encountered.  Patient was more comfortable after the epidural was dosed. Please see RN's note for documentation of vital signs and FHR which are stable.

## 2018-06-06 NOTE — Consult Note (Signed)
Neonatology Note:   Attendance at C-section:    I was asked by Dr. Ozan to attend this primary C/S at term due to failure of descent and failed vacuum-assisted delivery. The mother is a G1P0 AB pos, GBS neg with an uncomplicated pregnancy. ROM 5.5 hours prior to delivery, fluid clear. Infant OP, was vigorous with good spontaneous cry and tone. Delayed cord clamping was done. Needed no suctioning. Ap 9/9. Lungs clear to ausc in DR. Infant is able to remain with her mother for skin to skin time under nursing supervision. Transferred to the care of Pediatrician.   Carolyn Kiger C. Amair Shrout, MD 

## 2018-06-06 NOTE — Op Note (Signed)
PreOp Diagnosis: 1) intrauterine pregnancy @ [redacted]w[redacted]d  2) Failure to descend 3) Failed vacuum PostOp Diagnosis: same and OP presentation Procedure: Primary C-section, repair of vaginal wall laceration Surgeon: Dr. Myna Hidalgo Anesthesia: epidural Complications: none EBL: 917cc UOP: 200cc Fluids: 2300cc  Findings: Female infant from OP presentation, normal uterus, tubes and ovaries bilaterally  PROCEDURE:  Informed consent was obtained from the patient with risks, benefits, complications, treatment options, and expected outcomes discussed with the patient.  The patient concurred with the proposed plan, giving informed consent with form signed.   The patient was taken to Operating Room, and identified with the procedure verified as C-Section Delivery with Time Out. With induction of anesthesia, the patient was prepped and draped in the usual sterile fashion. A Pfannenstiel incision was made and carried down through the subcutaneous tissue to the fascia. The fascia was incised in the midline and extended transversely. The superior aspect of the fascial incision was grasped with Kochers elevated and the underlying muscle dissected off. The inferior aspect of the facial incision was in similar fashion, grasped elevated and rectus muscles dissected off. The peritoneum was identified and entered. Peritoneal incision was extended longitudinally. The utero-vesical peritoneal reflection was identified and incised transversely with the [facility] scissors, the incision extended laterally, the bladder flap created digitally. A low transverse uterine incision was made and the infant's head delivered atraumatically with vaginal assistance. After the umbilical cord was clamped and cut cord blood was obtained for evaluation.   The placenta was removed intact and appeared normal. The uterine outline, tubes and ovaries appeared normal. Uterine extension was noted- left lateral and was repaired in a running locked fashion.   The uterine incision was closed with running locked sutures of 0 Vicryl and a second layer of the same stitch was used in an imbricating fashion.  Excellent hemostasis was obtained.  The pericolic gutters were then cleared of all clots and debris. Arista was placed.  The peritoneum was closed in a running fashion. The fascia was then reapproximated with running sutures of 0 Vicryl. The subcutaneous tissue was reapproximated with 2-0 plain gut suture.  The skin was closed with 4-0 vicryl in a subcuticular fashion.  Instrument, sponge, and needle counts were correct prior the abdominal closure and at the conclusion of the case.   Vaginal examination was performed- right vaginal tear was noted and repaired using 3-0 vicryl.  Adequate hemostasis was obtained.  The patient was taken to recovery in stable condition.  Myna Hidalgo, DO [phone] (cell) [phone] (office)

## 2018-06-06 NOTE — Progress Notes (Signed)
OB PN:  S: Pt resting comfortably, no acute complaints  O: BP 110/68   Pulse 71   Resp 17   Ht 5\' 1"  (1.549 m)   Wt 70.8 kg   LMP 08/31/2017   SpO2 99%   BMI 29.48 kg/m   FHT: 115bpm, moderate variablity, + accels, no decels Toco: q5-77min SVE: deferred, per Kathalene Frames CNM @ [phone]: 5/100/-1, AROM  A/P: 32 y.o. G1P0 @ [redacted]w[redacted]d for labor 1. FWB: Cat. I 2. Labor: expectant management Pain: continue epidural GBS: negative  Carolyn Leake, DO [phone] (cell) [phone] (office)

## 2018-06-06 NOTE — Anesthesia Postprocedure Evaluation (Signed)
Anesthesia Post Note  Patient: Carolyn Garner  Procedure(s) Performed: CESAREAN SECTION (N/A Abdomen)     Patient location during evaluation: Mother Baby Anesthesia Type: Epidural Level of consciousness: awake and alert Pain management: pain level controlled Vital Signs Assessment: post-procedure vital signs reviewed and stable Respiratory status: spontaneous breathing, nonlabored ventilation and respiratory function stable Cardiovascular status: stable Postop Assessment: no headache, no backache and epidural receding Anesthetic complications: no    Last Vitals:  Vitals:   06/06/18 1600 06/06/18 1617  BP: 108/68 99/61  Pulse: 89 78  Resp: 18 18  Temp:  36.7 C  SpO2: 100% 98%    Last Pain:  Vitals:   06/06/18 1617  TempSrc: Oral  PainSc:    Pain Goal: Patients Stated Pain Goal: 3 (06/06/18 0732)               Lowella Curb

## 2018-06-06 NOTE — Progress Notes (Signed)
At bedside to discuss management.  Pt C/C/+3- Vacuum applied- no further descent noted.  Reviewed management options- pt desires to proceed with primary C-section.Risk benefits and alternatives of cesarean section were discussed with the patient including but not limited to infection, bleeding, damage to bowel , bladder and baby with the need for further surgery. Pt voiced understanding and desires to proceed  Yolonda Ryman, DO [phone] (cell) [phone] (office)

## 2018-06-06 NOTE — Transfer of Care (Signed)
Immediate Anesthesia Transfer of Care Note  Patient: Carolyn Garner  Procedure(s) Performed: CESAREAN SECTION (N/A Abdomen)  Patient Location: PACU  Anesthesia Type:Epidural  Level of Consciousness: awake, alert  and oriented  Airway & Oxygen Therapy: Patient Spontanous Breathing  Post-op Assessment: Report given to RN and Post -op Vital signs reviewed and stable  Post vital signs: Reviewed and stable  Last Vitals:  Vitals Value Taken Time  BP    Temp    Pulse 109 06/06/2018  2:58 PM  Resp 14 06/06/2018  2:58 PM  SpO2 100 % 06/06/2018  2:58 PM  Vitals shown include unvalidated device data.  Last Pain:  Vitals:   06/06/18 1224  TempSrc: Oral  PainSc:       Patients Stated Pain Goal: 3 (06/06/18 0732)  Complications: No apparent anesthesia complications

## 2018-06-06 NOTE — H&P (Signed)
Carolyn Garner is a 32 y.o. female presenting for labor. OB History    Gravida  1   Para      Term      Preterm      AB      Living  0     SAB      TAB      Ectopic      Multiple      Live Births             Past Medical History:  Diagnosis Date  . Medical history non-contributory    Past Surgical History:  Procedure Laterality Date  . WISDOM TOOTH EXTRACTION     Family History: family history includes Lung disease in her father; Polymyositis in her father; Ulcerative colitis in her sister. Social History:  reports that she has never smoked. She has never used smokeless tobacco. She reports that she drank alcohol. She reports that she does not use drugs.     Maternal Diabetes: No Genetic Screening: Normal Maternal Ultrasounds/Referrals: Normal Fetal Ultrasounds or other Referrals:  Referred to Materal Fetal Medicine , choroid plexus cyst Maternal Substance Abuse:  No Significant Maternal Medications:  None Significant Maternal Lab Results:  None Other Comments:  None  Review of Systems  Gastrointestinal: Positive for abdominal pain.  All other systems reviewed and are negative.  Maternal Medical History:  Reason for admission: Contractions.   Contractions: Onset was yesterday.   Frequency: regular.   Duration is approximately 90 seconds.   Perceived severity is strong.    Fetal activity: Perceived fetal activity is normal.   Last perceived fetal movement was within the past hour.    Prenatal Complications - Diabetes: none.    Dilation: 4 Effacement (%): 80 Station: -2 Exam by:: lauren cox rn    Vitals:   06/06/18 0130  Weight: 70.8 kg  Height: 5\' 1"  (1.549 m)    Maternal Exam:  Uterine Assessment: Contraction strength is moderate.  Contraction duration is 90 seconds. Contraction frequency is regular.   Abdomen: Patient reports no abdominal tenderness. Fundal height is Size=dates.   Estimated fetal weight is 7lbs.   Fetal  presentation: vertex  Introitus: Normal vulva. Normal vagina.  Pelvis: adequate for delivery.   Cervix: Cervix evaluated by digital exam.     Fetal Exam Fetal Monitor Review: Mode: ultrasound.   Baseline rate: 120.  Variability: moderate (6-25 bpm).   Pattern: accelerations present and no decelerations.    Fetal State Assessment: Category I - tracings are normal.     Physical Exam  Nursing note and vitals reviewed. Constitutional: She is oriented to person, place, and time. She appears well-developed and well-nourished.  HENT:  Head: Normocephalic.  Eyes: Pupils are equal, round, and reactive to light.  Cardiovascular: Normal rate, regular rhythm and normal heart sounds.  Respiratory: Effort normal and breath sounds normal.  GI: There is no tenderness.  Genitourinary: Vagina normal and uterus normal.  Musculoskeletal: Normal range of motion.  Neurological: She is alert and oriented to person, place, and time.  Skin: Skin is warm and dry.  Psychiatric: She has a normal mood and affect. Her behavior is normal. Judgment and thought content normal.    Prenatal labs: ABO, Rh: AB/Positive/-- (01/14 0000) Antibody: Negative (01/14 0000) Rubella: Immune (01/14 0000) RPR: Nonreactive (01/14 0000)  HBsAg: Negative (01/14 0000)  HIV: Non-reactive (01/14 0000)  GBS:   Negative, per patient as records not available from office   Assessment/Plan: 32 y.o. G1P0  at [redacted]w[redacted]d Category 1 FHTs Normal labor Admit to L&D Patient desires epidural Anticipate NSVD    Janeece Riggers 06/06/2018, 3:52 AM

## 2018-06-06 NOTE — Anesthesia Pain Management Evaluation Note (Signed)
  CRNA Pain Management Visit Note  Patient: Carolyn Garner, 32 y.o., female  "Hello I am a member of the anesthesia team at [facility]. We have an anesthesia team available at all times to provide care throughout the hospital, including epidural management and anesthesia for C-section. I don't know your plan for the delivery whether it a natural birth, water birth, IV sedation, nitrous supplementation, doula or epidural, but we want to meet your pain goals."   1.Was your pain managed to your expectations on prior hospitalizations?   No prior hospitalizations  2.What is your expectation for pain management during this hospitalization?     Epidural  3.How can we help you reach that goal? Maintain epidural until delivery.  Record the patient's initial score and the patient's pain goal.   Pain: 0  Pain Goal: 0 [facility] wants you to be able to say your pain was always managed very well.  Zalma Channing 06/06/2018

## 2018-06-06 NOTE — Lactation Note (Signed)
This note was copied from a baby's chart. Lactation Consultation Note  Patient Name: Girl Porshea Janowski [id number] Date: 06/06/2018 Reason for consult: Initial assessment;Primapara;1st time breastfeeding;Difficult latch;Term  66 hours old FT female who is being exclusively BF by her mother, she's a P24. RN called LC for assistance due to difficult latch, baby has not fed since birth. Mom took BF classes here at the [facility] and she has a Spectra S2 DEBP at home. Mom has already been set up with a hand pump and she's pumping and also working on hand expression. Reviewed hand expression with mom and she was able to get a few drops of colostrum out of her left breast, and only one big drop out of her right one.  LC showed parents how to finger feed baby. Did some suck training with baby prior latching, and once she got into a nice rhythmical pattern, she was transitioned to the left breast STS in a football position. After several attempts, baby would not latch on, she was very difficult to wake up. Advised parents to keep finger feeding baby any amount of EBM mom may get because baby is able to suck and swallow without any problems, she's just too sleepy to go to the breast. Discussed lactogenesis II.  Noticed that mom has asymmetrical nipples. Her left nipple is everted (short shafted) but her right nipple is almost flat. Set mom up with breast shells, she brought a nursing bra to the hospital and she'll start wearing them tomorrow. Shells instructions, cleaning and storage was reviewed. Dad was also present and very supportive. Both parents did everything LC suggested, explained to mom the benefits of starting pumping early on while at the hospital and she voiced she'd like to start tonight. Brought a DEBP kit, instructions, cleaning and storage were also discussed. Mom started double pumping during consult, praised her for her efforts.  Encouraged mom to feed baby STS 8-12 times/24 hours or sooner if feeding  cues are present. Discussed cluster feeding and baby sleeping cycle. If baby is not cueing by the 3 hour mark, parents will wake her up to feed, at the breast or with finger/spoon feeding. Mom will pump every 3 hours tomorrow and at least once at night. Reviewed BF brochure, BF resources and feeding diary, both parents are aware of [facility] services and will call PRN.  Maternal Data Has patient been taught Hand Expression?: Yes Does the patient have breastfeeding experience prior to this delivery?: No    Interventions Interventions: Breast feeding basics reviewed;Assisted with latch;Skin to skin;Breast massage;Hand express;Breast compression;Adjust position;Support pillows;Shells;DEBP;Pre-pump if needed  Lactation Tools Discussed/Used Tools: Shells;Pump Shell Type: Inverted Breast pump type: Double-Electric Breast Pump WIC Program: No Pump Review: Setup, frequency, and cleaning Initiated by:: MPeck Date initiated:: 06/06/18   Consult Status Consult Status: Follow-up Date: 06/07/18 Follow-up type: In-patient    Asah Lamay Francene Boyers 06/06/2018, 10:59 PM

## 2018-06-06 NOTE — Anesthesia Preprocedure Evaluation (Signed)
Anesthesia Evaluation  Patient identified by MRN, date of birth, ID band Patient awake    Reviewed: Allergy & Precautions, Patient's Chart, lab work & pertinent test results  Airway Mallampati: II  TM Distance: >3 FB Neck ROM: Full    Dental no notable dental hx. (+) Teeth Intact   Pulmonary neg pulmonary ROS,    Pulmonary exam normal breath sounds clear to auscultation       Cardiovascular negative cardio ROS Normal cardiovascular exam Rhythm:Regular Rate:Normal     Neuro/Psych negative neurological ROS     GI/Hepatic Neg liver ROS, GERD  ,  Endo/Other  negative endocrine ROS  Renal/GU negative Renal ROS  negative genitourinary   Musculoskeletal negative musculoskeletal ROS (+)   Abdominal   Peds  Hematology negative hematology ROS (+)   Anesthesia Other Findings   Reproductive/Obstetrics (+) Pregnancy                             Anesthesia Physical Anesthesia Plan  ASA: II  Anesthesia Plan: Epidural   Post-op Pain Management:    Induction:   PONV Risk Score and Plan:   Airway Management Planned: Natural Airway  Additional Equipment:   Intra-op Plan:   Post-operative Plan:   Informed Consent: I have reviewed the patients History and Physical, chart, labs and discussed the procedure including the risks, benefits and alternatives for the proposed anesthesia with the patient or authorized representative who has indicated his/her understanding and acceptance.     Plan Discussed with: Anesthesiologist  Anesthesia Plan Comments:         Anesthesia Quick Evaluation

## 2018-06-07 ENCOUNTER — Other Ambulatory Visit: Payer: Self-pay

## 2018-06-07 LAB — CBC
HCT: 25.1 % — ABNORMAL LOW (ref 36.0–46.0)
HEMOGLOBIN: 8.6 g/dL — AB (ref 12.0–15.0)
MCH: 32 pg (ref 26.0–34.0)
MCHC: 34.3 g/dL (ref 30.0–36.0)
MCV: 93.3 fL (ref 78.0–100.0)
Platelets: 176 10*3/uL (ref 150–400)
RBC: 2.69 MIL/uL — AB (ref 3.87–5.11)
RDW: 13.3 % (ref 11.5–15.5)
WBC: 12.2 10*3/uL — AB (ref 4.0–10.5)

## 2018-06-07 MED ORDER — FERROUS SULFATE 325 (65 FE) MG PO TABS
325.0000 mg | ORAL_TABLET | Freq: Every day | ORAL | Status: DC
  Administered 2018-06-07 – 2018-06-09 (×3): 325 mg via ORAL
  Filled 2018-06-07 (×3): qty 1

## 2018-06-07 NOTE — Lactation Note (Signed)
This note was copied from a baby's chart. Lactation Consultation Note  Patient Name: Carolyn Garner ENIDP'O Date: 06/07/2018 Reason for consult: Follow-up assessment;1st time breastfeeding;Difficult latch;Primapara;Term;Hyperbilirubinemia  Visited with P1 Mom of term baby at 46 hrs old.  Mom having difficulty latching baby and latch has been painful.  Nipples slightly abraded on tips. Offered to assist with latching.  Mom has been double pumping to stimulate her milk supply.   In football hold on left breast, baby unable to attain a deep latch.  Mom wincing in pain.   Oral assessment, palate arched, labial frenulum and short posterior lingual frenula noted.  When baby cries, tip of tongue curls up and center of tongue remains flat.  On finger suck assessment, tongue tends to stay back in mouth, and baby clamps down with her gums.    Initiated a 20 mm nipple shield, with instructions on cleaning and how to apply to breast.  Nipple pulled well into shield.  Baby did latch with instructions on breast support.  Baby wouldn't suck.  With permission, added Similac formula using 5 fr feeding tube and syringe under nipple shield.  Baby maintained a regular suck/swallow pattern.  Baby took 10 ml formula well.   Parents pleased with feeding plan, which LC wrote on dry erase board.    Plan- 1-Keep baby STS as much as possible, offering breast when baby cues to eat, or at 3 hrs if sleepy 2- use nipple shield to help baby latch deeply 3- Offer 12 ml of EBM+/formula using 5 fr feeding tube and syringe. 4- Pump both breasts 15-20 mins 5-Ask for help prn.  Consult Status Consult Status: Follow-up Date: 06/08/18 Follow-up type: In-patient    Carolyn Garner 06/07/2018, 4:56 PM

## 2018-06-07 NOTE — Anesthesia Postprocedure Evaluation (Signed)
Anesthesia Post Note  Patient: Carolyn Garner  Procedure(s) Performed: CESAREAN SECTION (N/A Abdomen)     Patient location during evaluation: Mother Baby Anesthesia Type: Epidural Level of consciousness: awake and alert and oriented Pain management: satisfactory to patient Vital Signs Assessment: post-procedure vital signs reviewed and stable Respiratory status: respiratory function stable Cardiovascular status: stable Postop Assessment: no headache, no backache, epidural receding, patient able to bend at knees, no signs of nausea or vomiting and adequate PO intake Anesthetic complications: no    Last Vitals:  Vitals:   06/07/18 0402 06/07/18 0622  BP: (!) 90/56   Pulse: 85   Resp: 17 16  Temp: 36.8 C   SpO2: 95% 96%    Last Pain:  Vitals:   06/07/18 0409  TempSrc:   PainSc: Asleep   Pain Goal: Patients Stated Pain Goal: 3 (06/06/18 0732)               Karleen Dolphin

## 2018-06-07 NOTE — Addendum Note (Signed)
Addendum  created 06/07/18 9741 by Graciela Husbands, CRNA   Sign clinical note

## 2018-06-07 NOTE — Progress Notes (Addendum)
Postpartum/postop Note Day # 1  S:  Patient resting comfortable in bed.  Pain controlled.  Tolerating general diet. + flatus, no BM.  Lochia moderate.  Ambulating without difficulty.  She denies n/v/f/c, SOB, or CP.  Pt plans on breastfeeding.  O: Temp:  [97.7 F (36.5 C)-99.8 F (37.7 C)] 98.2 F (36.8 C) (09/05 0402) Pulse Rate:  [70-130] 85 (09/05 0402) Resp:  [16-21] 16 (09/05 0622) BP: (90-135)/(56-90) 90/56 (09/05 0402) SpO2:  [95 %-100 %] 96 % (09/05 0622)  UOP: 1400cc/8hr  Gen: A&Ox3, NAD CV: RRR Resp: CTAB Abdomen: soft, NT, ND +BS Uterus: firm, non-tender, below umbilicus Incision: honeycomb saturated with old blood Ext: No edema, no calf tenderness bilaterally, SCDs in place  Labs:  Recent Labs    06/06/18 0310 06/07/18 0539  HGB 13.9 8.6*    A/P: Pt is a 32 y.o. G1P1001 s/p primary C-section due to arrest of descent, POD#1  - Pain well controlled -GU: adequate UOP, plan to remove foley today -GI: Tolerating general diet -Incision: plan for dressing change today -Activity: encouraged sitting up to chair and ambulation as tolerated -Prophylaxis: early ambulation SCDs while in bed -Labs: appropriate for recent surgery, plan for iron daily  DISPO: Continue routine postop care  Kyrene Hillenbrand, DO [phone] (cell) [phone] (office)

## 2018-06-08 NOTE — Progress Notes (Signed)
Postpartum/postop Note Day # 2  S:  Patient resting comfortable in bed.  Pain controlled.  Tolerating general diet. + flatus, no BM.  Lochia moderate.  Ambulating without difficulty.  She denies n/v/f/c, SOB, or CP.  Voiding freely. Pt plans on breastfeeding.  O: Temp:  [97.9 F (36.6 C)-98.5 F (36.9 C)] 98.5 F (36.9 C) (09/05 2216) Pulse Rate:  [78-79] 79 (09/05 2216) Resp:  [16-18] 16 (09/05 2216) BP: (92-102)/(47-59) 92/59 (09/05 2216) SpO2:  [97 %-98 %] 97 % (09/05 2216)  UOP: 1400cc/8hr  Gen: A&Ox3, NAD CV: RRR Resp: CTAB Abdomen: soft, NT, ND +BS Uterus: firm, non-tender, below umbilicus Incision: C/D/I, honeycomb in place Ext: minimal bilateral edema, no calf tenderness bilaterally  Labs:  Recent Labs    06/06/18 0310 06/07/18 0539  HGB 13.9 8.6*    A/P: Pt is a 32 y.o. G1P1001 s/p primary C-section due to arrest of descent, POD#2  - Pain well controlled -GU: voiding freely -GI: Tolerating general diet -Activity: encouraged sitting up to chair and ambulation as tolerated -Prophylaxis: early ambulation, SCDs while in bed -Labs: appropriate for recent surgery, plan for iron daily  DISPO: Continue routine postop care, plan for discharge home tomorrow  Hayle Tacey, DO [phone] (cell) [phone] (office)

## 2018-06-08 NOTE — Lactation Note (Addendum)
This note was copied from a baby's chart. Lactation Consultation Note  Patient Name: Carolyn Garner [id number] Date: 06/08/2018 Reason for consult: Follow-up assessment;Infant weight loss;1st time breastfeeding;Primapara;Hyperbilirubinemia;Term;Difficult latch  57 hours old FT female who is now being partially BF and formula fed by her mother she's a P1. Per mom BF is getting a little better now, she's been supplementing at the breast with 15 cc of Similac 19 calorie formula due to hyperbilirubinemia using a 62F tube syringe and  NS # 20. Baby was finishing her feeding when LC entered the room. Mom was concerned about her milk not coming in yet, explained to mom in detailed the process of lactogenesis II and that it may take a bit longer for first time moms. Dad present and very supportive, they both voiced understanding. Parents had lots of questions, and were very engaged in [facility] consultation.  Mom is pumping every 3 hours but very discouraged because she hasn't got any milk yet. Reviewed hand expression with mom and she was able to get big drops of colostrum out of her left breast, praised her for her efforts. Advised mom to start using coconut oil prior pumping, asked RN Marcelino Duster to get her some for her next pumping session. Showed parents how to burp baby when she's wrapped in blue blanket, they're very appreciative. Per mom baby started cluster feeding today, discussed cluster feeding and reviewed newborn sleeping cycle. Parents no longer concern about baby's mouth, MD outruled possible posterior lingual frenula.  Plan:  1. Encouraged mom to feed baby STS  with NS # 20 and a 5 F feeding tube to supplements feeding at the breast. Current amount is 15 ml, but if baby is still showing hunger cues, she could take up to 20 ml per feeding. 2. Bilateral pumping every 3 hours, at least 6 times/24 hours including once at night 3. Feed baby any amount of EBM she may get through pumping 4. Feeding plan  will be reasessed tomorrow before parents go home with baby  Parents reported all questions were answered; they're both aware of LC services and will call PRN.  Maternal Data    Feeding Feeding Type: Formula    Interventions Interventions: Breast feeding basics reviewed;Breast compression;Hand express;Breast massage;Coconut oil;Expressed milk  Lactation Tools Discussed/Used Tools: Coconut oil   Consult Status Consult Status: Follow-up Date: 06/09/18 Follow-up type: In-patient    Annina Piotrowski Venetia Constable 06/08/2018, 6:08 PM

## 2018-06-09 MED ORDER — IBUPROFEN 600 MG PO TABS: 600.0000 mg | ORAL_TABLET | Freq: Four times a day (QID) | ORAL | 0 refills | Status: DC | PRN

## 2018-06-09 MED ORDER — FERROUS SULFATE 325 (65 FE) MG PO TABS: 325.0000 mg | ORAL_TABLET | Freq: Every day | ORAL | 2 refills | Status: DC

## 2018-06-09 MED ORDER — OXYCODONE HCL 5 MG PO TABS: 5.0000 mg | ORAL_TABLET | Freq: Four times a day (QID) | ORAL | 0 refills | Status: AC | PRN

## 2018-06-09 NOTE — Discharge Instructions (Signed)
Cesarean Delivery °Cesarean birth, or cesarean delivery, is the surgical delivery of a baby through an incision in the abdomen and the uterus. This may be referred to as a C-section. This procedure may be scheduled ahead of time, or it may be done in an emergency situation. °Tell a health care provider about: °· Any allergies you have. °· All medicines you are taking, including vitamins, herbs, eye drops, creams, and over-the-counter medicines. °· Any problems you or family members have had with anesthetic medicines. °· Any blood disorders you have. °· Any surgeries you have had. °· Any medical conditions you have. °· Whether you or any members of your family have a history of deep vein thrombosis (DVT) or pulmonary embolism (PE). °What are the risks? °Generally, this is a safe procedure. However, problems may occur, including: °· Infection. °· Bleeding. °· Allergic reactions to medicines. °· Damage to other structures or organs. °· Blood clots. °· Injury to your baby. ° °What happens before the procedure? °· Follow instructions from your health care provider about eating or drinking restrictions. °· Follow instructions from your health care provider about bathing before your procedure to help reduce your risk of infection. °· If you know that you are going to have a cesarean delivery, do not shave your pubic area. Shaving before the procedure may increase your risk of infection. °· Ask your health care provider about: °? Changing or stopping your regular medicines. This is especially important if you are taking diabetes medicines or blood thinners. °? Your pain management plan. This is especially important if you plan to breastfeed your baby. °? How long you will be in the hospital after the procedure. °? Any concerns you may have about receiving blood products if you need them during the procedure. °? Cord blood banking, if you plan to collect your baby’s umbilical cord blood. °· You may also want to ask your  health care provider: °? Whether you will be able to hold or breastfeed your baby while you are still in the operating room. °? Whether your baby can stay with you immediately after the procedure and during your recovery. °? Whether a family member or a person of your choice can go with you into the operating room and stay with you during the procedure, immediately after the procedure, and during your recovery. °· Plan to have someone drive you home when you are discharged from the hospital. °What happens during the procedure? °· Fetal monitors will be placed on your abdomen to monitor your heart rate and your baby's heart rate. °· Depending on the reason for your cesarean delivery, you may have a physical exam or additional testing, such as an ultrasound. °· An IV tube will be inserted into one of your veins. °· You may have your blood or urine tested. °· You will be given antibiotic medicine to help prevent infection. °· You may be given a special warming gown to wear to keep your temperature stable. °· Hair may be removed from your pubic area. °· The skin of your pubic area and lower abdomen will be cleaned with a germ-killing solution (antiseptic). °· A catheter may be inserted into your bladder through your urethra. This drains your urine during the procedure. °· You may be given one or more of the following: °? A medicine to numb the area (local anesthetic). °? A medicine to make you fall asleep (general anesthetic). °? A medicine (regional anesthetic) that is injected into your back or through a small   thin tube placed in your back (spinal anesthetic or epidural anesthetic). This numbs everything below the injection site and allows you to stay awake during your procedure. If this makes you feel nauseous, tell your health care provider. Medicines will be available to help reduce any nausea you may feel.  An incision will be made in your abdomen, and then in your uterus.  If you are awake during your  procedure, you may feel tugging and pulling in your abdomen, but you should not feel pain. If you feel pain, tell your health care provider immediately.  Your baby will be removed from your uterus. You may feel more pressure or pushing while this happens.  Immediately after birth, your baby will be dried and kept warm. You may be able to hold and breastfeed your baby. The umbilical cord may be clamped and cut during this time.  Your placenta will be removed from your uterus.  Your incisions will be closed with stitches (sutures). Staples, skin glue, or adhesive strips may also be applied to the incision in your abdomen.  Bandages (dressings) will be placed over the incision in your abdomen. The procedure may vary among health care providers and hospitals. What happens after the procedure?  Your blood pressure, heart rate, breathing rate, and blood oxygen level will be monitored often until the medicines you were given have worn off.  You may continue to receive fluids and medicines through an IV tube.  You will have some pain. Medicines will be available to help control your pain.  To help prevent blood clots: ? You may be given medicines. ? You may have to wear compression stockings or devices. ? You will be encouraged to walk around when you are able.  Hospital staff will encourage and support bonding with your baby. Your hospital may allow you and your baby to stay in the same room (rooming in) during your hospital stay to encourage successful breastfeeding.  You may be encouraged to cough and breathe deeply often. This helps to prevent lung problems.  If you have a catheter draining your urine, it will be removed as soon as possible after your procedure. This information is not intended to replace advice given to you by your health care provider. Make sure you discuss any questions you have with your health care provider. Document Released: 09/19/2005 Document Revised: 02/25/2016  Document Reviewed: 06/30/2015 Elsevier Interactive Patient Education  2018 [employer].  Postpartum Depression and Baby Blues The postpartum period begins right after the birth of a baby. During this time, there is often a great amount of joy and excitement. It is also a time of many changes in the life of the parents. Regardless of how many times a mother gives birth, each child brings new challenges and dynamics to the family. It is not unusual to have feelings of excitement along with confusing shifts in moods, emotions, and thoughts. All mothers are at risk of developing postpartum depression or the "baby blues." These mood changes can occur right after giving birth, or they may occur many months after giving birth. The baby blues or postpartum depression can be mild or severe. Additionally, postpartum depression can go away rather quickly, or it can be a long-term condition. What are the causes? Raised hormone levels and the rapid drop in those levels are thought to be a main cause of postpartum depression and the baby blues. A number of hormones change during and after pregnancy. Estrogen and progesterone usually decrease right after the  delivery of your baby. The levels of thyroid hormone and various cortisol steroids also rapidly drop. Other factors that play a role in these mood changes include major life events and genetics. What increases the risk? If you have any of the following risks for the baby blues or postpartum depression, know what symptoms to watch out for during the postpartum period. Risk factors that may increase the likelihood of getting the baby blues or postpartum depression include:  Having a personal or family history of depression.  Having depression while being pregnant.  Having premenstrual mood issues or mood issues related to oral contraceptives.  Having a lot of life stress.  Having marital conflict.  Lacking a social support network.  Having a baby with  special needs.  Having health problems, such as diabetes.  What are the signs or symptoms? Symptoms of baby blues include:  Brief changes in mood, such as going from extreme happiness to sadness.  Decreased concentration.  Difficulty sleeping.  Crying spells, tearfulness.  Irritability.  Anxiety.  Symptoms of postpartum depression typically begin within the first month after giving birth. These symptoms include:  Difficulty sleeping or excessive sleepiness.  Marked weight loss.  Agitation.  Feelings of worthlessness.  Lack of interest in activity or food.  Postpartum psychosis is a very serious condition and can be dangerous. Fortunately, it is rare. Displaying any of the following symptoms is cause for immediate medical attention. Symptoms of postpartum psychosis include:  Hallucinations and delusions.  Bizarre or disorganized behavior.  Confusion or disorientation.  How is this diagnosed? A diagnosis is made by an evaluation of your symptoms. There are no medical or lab tests that lead to a diagnosis, but there are various questionnaires that a health care provider may use to identify those with the baby blues, postpartum depression, or psychosis. Often, a screening tool called the [place] Postnatal Depression Scale is used to diagnose depression in the postpartum period. How is this treated? The baby blues usually goes away on its own in 1-2 weeks. Social support is often all that is needed. You will be encouraged to get adequate sleep and rest. Occasionally, you may be given medicines to help you sleep. Postpartum depression requires treatment because it can last several months or longer if it is not treated. Treatment may include individual or group therapy, medicine, or both to address any social, physiological, and psychological factors that may play a role in the depression. Regular exercise, a healthy diet, rest, and social support may also be strongly  recommended. Postpartum psychosis is more serious and needs treatment right away. Hospitalization is often needed. Follow these instructions at home:  Get as much rest as you can. Nap when the baby sleeps.  Exercise regularly. Some women find yoga and walking to be beneficial.  Eat a balanced and nourishing diet.  Do little things that you enjoy. Have a cup of tea, take a bubble bath, read your favorite magazine, or listen to your favorite music.  Avoid alcohol.  Ask for help with household chores, cooking, grocery shopping, or running errands as needed. Do not try to do everything.  Talk to people close to you about how you are feeling. Get support from your partner, family members, friends, or other new moms.  Try to stay positive in how you think. Think about the things you are grateful for.  Do not spend a lot of time alone.  Only take over-the-counter or prescription medicine as directed by your health care provider.  Keep all your postpartum appointments.  Let your health care provider know if you have any concerns. Contact a health care provider if: You are having a reaction to or problems with your medicine. Get help right away if:  You have suicidal feelings.  You think you may harm the baby or someone else. This information is not intended to replace advice given to you by your health care provider. Make sure you discuss any questions you have with your health care provider. Document Released: 06/23/2004 Document Revised: 02/25/2016 Document Reviewed: 07/01/2013 Elsevier Interactive Patient Education  2017 [employer].  Norethindrone tablets (contraception) What is this medicine? NORETHINDRONE (nor eth IN drone) is an oral contraceptive. The product contains a female hormone known as a progestin. It is used to prevent pregnancy. This medicine may be used for other purposes; ask your health care provider or pharmacist if you have questions. COMMON BRAND NAME(S):  Camila, Deblitane 28-Day, Errin, Heather, [place], Jolivette, Summer Set, Nor-QD, Nora-BE, Norlyroc, Ortho Micronor, [employer] 28-Day What should I tell my health care provider before I take this medicine? They need to know if you have any of these conditions: -blood vessel disease or blood clots -breast, cervical, or vaginal cancer -diabetes -heart disease -kidney disease -liver disease -mental depression -migraine -seizures -stroke -vaginal bleeding -an unusual or allergic reaction to norethindrone, other medicines, foods, dyes, or preservatives -pregnant or trying to get pregnant -breast-feeding How should I use this medicine? Take this medicine by mouth with a glass of water. You may take it with or without food. Follow the directions on the prescription label. Take this medicine at the same time each day and in the order directed on the package. Do not take your medicine more often than directed. Contact your pediatrician regarding the use of this medicine in children. Special care may be needed. This medicine has been used in female children who have started having menstrual periods. A patient package insert for the product will be given with each prescription and refill. Read this sheet carefully each time. The sheet may change frequently. Overdosage: If you think you have taken too much of this medicine contact a poison control center or emergency room at once. NOTE: This medicine is only for you. Do not share this medicine with others. What if I miss a dose? Try not to miss a dose. Every time you miss a dose or take a dose late your chance of pregnancy increases. When 1 pill is missed (even if only 3 hours late), take the missed pill as soon as possible and continue taking a pill each day at the regular time (use a back up method of birth control for the next 48 hours). If more than 1 dose is missed, use an additional birth control method for the rest of your pill pack until menses occurs.  Contact your health care professional if more than 1 dose has been missed. What may interact with this medicine? Do not take this medicine with any of the following medications: -amprenavir or fosamprenavir -bosentan This medicine may also interact with the following medications: -antibiotics or medicines for infections, especially rifampin, rifabutin, rifapentine, and griseofulvin, and possibly penicillins or tetracyclines -aprepitant -barbiturate medicines, such as phenobarbital -carbamazepine -felbamate -modafinil -oxcarbazepine -phenytoin -ritonavir or other medicines for HIV infection or AIDS -St. John's wort -topiramate This list may not describe all possible interactions. Give your health care provider a list of all the medicines, herbs, non-prescription drugs, or dietary supplements you use. Also tell them if you  smoke, drink alcohol, or use illegal drugs. Some items may interact with your medicine. What should I watch for while using this medicine? Visit your doctor or health care professional for regular checks on your progress. You will need a regular breast and pelvic exam and Pap smear while on this medicine. Use an additional method of birth control during the first cycle that you take these tablets. If you have any reason to think you are pregnant, stop taking this medicine right away and contact your doctor or health care professional. If you are taking this medicine for hormone related problems, it may take several cycles of use to see improvement in your condition. This medicine does not protect you against HIV infection (AIDS) or any other sexually transmitted diseases. What side effects may I notice from receiving this medicine? Side effects that you should report to your doctor or health care professional as soon as possible: -breast tenderness or discharge -pain in the abdomen, chest, groin or leg -severe headache -skin rash, itching, or hives -sudden shortness of  breath -unusually weak or tired -vision or speech problems -yellowing of skin or eyes Side effects that usually do not require medical attention (report to your doctor or health care professional if they continue or are bothersome): -changes in sexual desire -change in menstrual flow -facial hair growth -fluid retention and swelling -headache -irritability -nausea -weight gain or loss This list may not describe all possible side effects. Call your doctor for medical advice about side effects. You may report side effects to FDA at 1-800-FDA-1088. Where should I keep my medicine? Keep out of the reach of children. Store at room temperature between 15 and 30 degrees C (59 and 86 degrees F). Throw away any unused medicine after the expiration date. NOTE: This sheet is a summary. It may not cover all possible information. If you have questions about this medicine, talk to your doctor, pharmacist, or health care provider.  2018 Elsevier/Gold Standard (2012-06-08 16:41:35)

## 2018-06-09 NOTE — Discharge Summary (Signed)
OB Discharge Summary     Patient Name: Carolyn Garner DOB: January 03, 1986 MRN: [id number]  Date of admission: 06/06/2018 Delivering MD: Myna Hidalgo   Date of discharge: 06/09/2018  Admitting diagnosis: 39WKS CTX Intrauterine pregnancy: [redacted]w[redacted]d     Secondary diagnosis:  Active Problems:   Normal labor   Cesarean delivery delivered  Additional problems: Mild asymptomatic anemia postpartum     Discharge diagnosis: Term Pregnancy Delivered and Anemia                                                                                                Post partum procedures:n/a  Augmentation: AROM  Complications: None  Hospital course:  Onset of Labor With Unplanned C/S  32 y.o. yo G1P1001 at [redacted]w[redacted]d was admitted in Active Labor on 06/06/2018. Patient had a labor course significant for arrest of descent in second stage. Membrane Rupture Time/Date: 6:17 AM ,06/06/2018   The patient went for cesarean section due to Arrest of Descent, and delivered a Viable infant,06/06/2018  Details of operation can be found in separate operative note. Patient had an uncomplicated postpartum course.  She is ambulating,tolerating a regular diet, passing flatus, and urinating well.  Patient is discharged home in stable condition 06/09/18.  Physical exam  Vitals:   06/07/18 2216 06/08/18 1415 06/08/18 2259 06/09/18 0616  BP: (!) 92/59 102/63 102/68 107/66  Pulse: 79 86 87 69  Resp: 16 16 15 16   Temp: 98.5 F (36.9 C) 97.6 F (36.4 C) 98 F (36.7 C) 97.8 F (36.6 C)  TempSrc: Oral Oral Oral Oral  SpO2: 97% 98%    Weight:      Height:       General: alert, cooperative and no distress Lochia: appropriate Uterine Fundus: firm Incision: Healing well with no significant drainage, No significant erythema, Dressing is clean, dry, and intact DVT Evaluation: No evidence of DVT seen on physical exam. Negative Homan's sign. No cords or calf tenderness. No significant calf/ankle edema. Labs: Lab Results  Component  Value Date   WBC 12.2 (H) 06/07/2018   HGB 8.6 (L) 06/07/2018   HCT 25.1 (L) 06/07/2018   MCV 93.3 06/07/2018   PLT 176 06/07/2018   No flowsheet data found.  Discharge instruction: per After Visit Summary and "Baby and Me Booklet".  After visit meds:  Allergies as of 06/09/2018   No Known Allergies     Medication List    TAKE these medications   ferrous sulfate 325 (65 FE) MG tablet Take 1 tablet (325 mg total) by mouth daily with breakfast. Start taking on:  06/10/2018   ibuprofen 600 MG tablet Commonly known as:  ADVIL,MOTRIN Take 1 tablet (600 mg total) by mouth every 6 (six) hours as needed for mild pain or moderate pain.   oxyCODONE 5 MG immediate release tablet Commonly known as:  Oxy IR/ROXICODONE Take 1 tablet (5 mg total) by mouth every 6 (six) hours as needed for up to 7 days for severe pain.   PRENATAL VITAMIN PO Take by mouth.       Diet: routine diet, PO iron for asymptomatic anemia  Activity: Advance as tolerated. Pelvic rest for 6 weeks.   Outpatient follow up:6 weeks Follow up Appt:No future appointments. Follow up Visit:No follow-ups on file.  Postpartum contraception: Progesterone only pills  Newborn Data: Live born female  Birth Weight: 6 lb 6.5 oz (2905 g) APGAR: 9, 9  Newborn Delivery   Birth date/time:  06/06/2018 13:39:00 Delivery type:  C-Section, Low Transverse Trial of labor:  Yes C-section categorization:  Primary     Baby Feeding: Breast Disposition:home with mother   06/09/2018 Janeece Riggers, CNM

## 2018-06-09 NOTE — Lactation Note (Signed)
This note was copied from a baby's chart. Lactation Consultation Note  Patient Name: Carolyn Garner SRPRX'Y Date: 06/09/2018 Reason for consult: Follow-up assessment;Infant weight loss;1st time breastfeeding;Primapara;Term;Difficult latch;Hyperbilirubinemia  70 hours old FT female who is still being supplemented Similac 19 calorie formula at the breast with a 5 F feeding tube and a NS # 20. Mom and baby are going home today. Baby's bilirubin slightly elevated from yesterday but baby already has a Dr. Alfonzo Beers tomorrow to be rechecked.   Parents asked for latch assistance because they're concerned that baby wasn't having a deep latch with the NS. Mom requested a larger size NS, tried the NS #24 but it was too big for her, did the latch score with the # 20. Mom agreed to wake baby up to assess the latch and LC took her STS to mother's breast in football position wearing just the NS # 20 and baby was able to latch deeply for a couple of minutes (showed dad what a deep latch looks like) but she fell asleep.   Documented attempt on Flowsheets and did a latch score even though it was just an attempt.  Mom's breast feeling a bit fuller today, but she was able to pump only 0.5 ml dad finger fed baby while during consult. Reviewed discharge instructions, engorgement prevention and treatment, and signs on when to call baby's pediatrician.   Plan:  1. Encouraged mom to feed baby STS  with NS # 20 and a 5 F feeding tube to supplements feeding at the breast. Current amount is 24 ml, but now that baby is 44 days old will be increase to at least one ounce 2. Bilateral pumping every 3 hours, at least 6 times/24 hours including once at night 3. Feed baby any amount of EBM she may get through pumping 4. Feeding plan will be reasessed tomorrow when parents go to baby's pediatrician  Parents will continue following the plan, both parents are aware of LC OP services and will call PRN.  Maternal Data     Feeding Feeding Type: Breast Fed Length of feed: 10 min  LATCH Score Latch: Repeated attempts needed to sustain latch, nipple held in mouth throughout feeding, stimulation needed to elicit sucking reflex.(sleepy baby, parents requested to wake her up)  Audible Swallowing: None(with NS # 20)  Type of Nipple: Everted at rest and after stimulation(short shafted)  Comfort (Breast/Nipple): Filling, red/small blisters or bruises, mild/mod discomfort(no signs of trauma, just mild discomfort)  Hold (Positioning): Assistance needed to correctly position infant at breast and maintain latch.  LATCH Score: 5  Interventions Interventions: Breast feeding basics reviewed;Assisted with latch;Skin to skin;Breast massage;Breast compression;Adjust position;Expressed milk;Support pillows;Position options;Hand express  Lactation Tools Discussed/Used Tools: 41F feeding tube / Syringe Nipple shield size: 20 Breast pump type: Double-Electric Breast Pump   Consult Status Consult Status: Complete Date: 06/09/18 Follow-up type: Call as needed    Cadence Minton Venetia Constable 06/09/2018, 12:32 PM

## 2018-06-14 ENCOUNTER — Inpatient Hospital Stay (HOSPITAL_COMMUNITY): Admission: RE | Admit: 2018-06-14 | Payer: 59 | Source: Ambulatory Visit | Admitting: Obstetrics & Gynecology

## 2018-07-09 DIAGNOSIS — O9989 Other specified diseases and conditions complicating pregnancy, childbirth and the puerperium: Secondary | ICD-10-CM | POA: Diagnosis not present

## 2018-07-09 DIAGNOSIS — Z3482 Encounter for supervision of other normal pregnancy, second trimester: Secondary | ICD-10-CM | POA: Diagnosis not present

## 2018-07-10 DIAGNOSIS — H5203 Hypermetropia, bilateral: Secondary | ICD-10-CM | POA: Diagnosis not present

## 2018-10-30 MED FILL — TRIAMCINOLONE 0.1% CREAM: 0.1 | 10 days supply | Qty: 60 | Fill #0

## 2019-07-04 LAB — OB RESULTS CONSOLE HIV ANTIBODY (ROUTINE TESTING): HIV: NONREACTIVE

## 2019-07-04 LAB — OB RESULTS CONSOLE HEPATITIS B SURFACE ANTIGEN: Hepatitis B Surface Ag: NEGATIVE

## 2019-07-04 LAB — OB RESULTS CONSOLE ABO/RH: "RH Type ": POSITIVE

## 2019-07-04 LAB — OB RESULTS CONSOLE GC/CHLAMYDIA
Chlamydia: NEGATIVE
Gonorrhea: NEGATIVE

## 2019-07-04 LAB — OB RESULTS CONSOLE ANTIBODY SCREEN: Antibody Screen: NEGATIVE

## 2019-07-04 LAB — OB RESULTS CONSOLE RPR: RPR: NONREACTIVE

## 2019-07-04 LAB — OB RESULTS CONSOLE GBS: GBS: POSITIVE

## 2019-07-04 LAB — OB RESULTS CONSOLE RUBELLA ANTIBODY, IGM: Rubella: IMMUNE

## 2019-08-19 MED FILL — AMOXICILLIN 500 MG CAPSULE: 500 | 5 days supply | Qty: 10 | Fill #0

## 2019-10-04 NOTE — L&D Delivery Note (Addendum)
Delivery Note Labor onset: 02/12/2020  Labor Onset Time: 0100 Complete dilation at 6:07 AM  Onset of pushing at 0615 FHR second stage Cat 1 Analgesia/Anesthesia intrapartum: Epidural placed during transition  Admitted in active labor desiring a TOLAC w/ advance dilation. Rapid progression from 7-10 cm. No amniotic fluid noted, hole in amniotic sac visualized as vertex is crowning. Involuntary pushing w/ strong maternal urge. Delivery of a viable female at [id number]. Fetal head delivered in LOA position compounded by anterior hand and arm. Nuchal cord N/A. Spont, lusty cry at birth, infant placed on maternal abd, dried, and tactile stim.  Cord double clamped and cut after 2 min by Weston Brass, father.  Cord blood sample collected Arterial cord blood sample N/A.  Placenta delivered Tomasa Blase, intact, with 3 VC.  Placenta to L&D. Uterine tone firm, bleeding brisk immediately after placenta then reduced w/ vigorous fundal massage.  2nd degree laceration identified. Dr. Charlotta Newton entered room and assumed care for repair.   Anesthesia: 1% Lidocaine Repair:  QBL/EBL (mL):  Complications: none APGAR: APGAR (1 MIN): 8   APGAR (5 MINS): 9   APGAR (10 MINS):   Mom to postpartum.  Baby to Couplet care / Skin to Skin.  Carolyn Schanz MSN, CNM 02/12/2020, 6:46 AM  ADDENDUM: As mentioned, delivery completed by Carolyn Garner.  Repair completed as following: 2nd degree noted- repaired in the usual fashion using 2-0 and 3-0 vicryl.  EBL 250cc  Carolyn Bruning, DO [phone] (cell) [phone] (office)

## 2019-12-05 ENCOUNTER — Other Ambulatory Visit: Payer: Self-pay | Admitting: Obstetrics & Gynecology

## 2019-12-05 DIAGNOSIS — M79662 Pain in left lower leg: Secondary | ICD-10-CM

## 2019-12-09 ENCOUNTER — Ambulatory Visit
Admission: RE | Admit: 2019-12-09 | Discharge: 2019-12-09 | Disposition: A | Discharge status: Home / Self Care | Payer: No Typology Code available for payment source | Source: Ambulatory Visit | Attending: Obstetrics & Gynecology | Admitting: Obstetrics & Gynecology

## 2019-12-09 DIAGNOSIS — M79662 Pain in left lower leg: Secondary | ICD-10-CM

## 2020-01-30 ENCOUNTER — Encounter (HOSPITAL_COMMUNITY): Payer: Self-pay | Admitting: Obstetrics & Gynecology

## 2020-01-30 ENCOUNTER — Other Ambulatory Visit: Payer: Self-pay

## 2020-01-30 ENCOUNTER — Inpatient Hospital Stay (HOSPITAL_BASED_OUTPATIENT_CLINIC_OR_DEPARTMENT_OTHER): Payer: No Typology Code available for payment source

## 2020-01-30 ENCOUNTER — Inpatient Hospital Stay (HOSPITAL_COMMUNITY)
Admission: RE | Admit: 2020-01-30 | Discharge: 2020-01-30 | Disposition: A | Discharge status: Home / Self Care | Payer: No Typology Code available for payment source | Attending: Obstetrics & Gynecology | Admitting: Obstetrics & Gynecology

## 2020-01-30 DIAGNOSIS — O4103X1 Oligohydramnios, third trimester, fetus 1: Secondary | ICD-10-CM

## 2020-01-30 DIAGNOSIS — O4103X0 Oligohydramnios, third trimester, not applicable or unspecified: Secondary | ICD-10-CM

## 2020-01-30 DIAGNOSIS — Z3A38 38 weeks gestation of pregnancy: Secondary | ICD-10-CM | POA: Insufficient documentation

## 2020-01-30 DIAGNOSIS — O418X30 Other specified disorders of amniotic fluid and membranes, third trimester, not applicable or unspecified: Secondary | ICD-10-CM | POA: Diagnosis present

## 2020-01-30 LAB — URINALYSIS, ROUTINE W REFLEX MICROSCOPIC
Bilirubin Urine: NEGATIVE
Glucose, UA: NEGATIVE mg/dL
Hgb urine dipstick: NEGATIVE
Ketones, ur: NEGATIVE mg/dL
Leukocytes,Ua: NEGATIVE
Nitrite: NEGATIVE
Protein, ur: NEGATIVE mg/dL
Specific Gravity, Urine: 1.015 (ref 1.005–1.030)
pH: 6 (ref 5.0–8.0)

## 2020-01-30 NOTE — MAU Note (Signed)
.   Carolyn Garner is a 34 y.o. at [redacted]w[redacted]d here in MAU reporting: sent from office for U/S for low amniotic fluid. Denies any pain or LOF  Onset of complaint: today Pain score: 0 Vitals:   01/30/20 0908  BP: 121/81  Pulse: 89  Resp: 16  Temp: 97.7 F (36.5 C)  SpO2: 98%     FHT:140 Lab orders placed from triage: UA

## 2020-01-30 NOTE — Discharge Instructions (Signed)
Fetal Movement Counts Patient Name: ________________________________________________ Patient Due Date: ____________________ What is a fetal movement count?  A fetal movement count is the number of times that you feel your baby move during a certain amount of time. This may also be called a fetal kick count. A fetal movement count is recommended for every pregnant woman. You may be asked to start counting fetal movements as early as week 28 of your pregnancy. Pay attention to when your baby is most active. You may notice your baby's sleep and wake cycles. You may also notice things that make your baby move more. You should do a fetal movement count:  When your baby is normally most active.  At the same time each day. A good time to count movements is while you are resting, after having something to eat and drink. How do I count fetal movements? 1. Find a quiet, comfortable area. Sit, or lie down on your side. 2. Write down the date, the start time and stop time, and the number of movements that you felt between those two times. Take this information with you to your health care visits. 3. Write down your start time when you feel the first movement. 4. Count kicks, flutters, swishes, rolls, and jabs. You should feel at least 10 movements. 5. You may stop counting after you have felt 10 movements, or if you have been counting for 2 hours. Write down the stop time. 6. If you do not feel 10 movements in 2 hours, contact your health care provider for further instructions. Your health care provider may want to do additional tests to assess your baby's well-being. Contact a health care provider if:  You feel fewer than 10 movements in 2 hours.  Your baby is not moving like he or she usually does. Date: ____________ Start time: ____________ Stop time: ____________ Movements: ____________ Date: ____________ Start time: ____________ Stop time: ____________ Movements: ____________ Date: ____________  Start time: ____________ Stop time: ____________ Movements: ____________ Date: ____________ Start time: ____________ Stop time: ____________ Movements: ____________ Date: ____________ Start time: ____________ Stop time: ____________ Movements: ____________ Date: ____________ Start time: ____________ Stop time: ____________ Movements: ____________ Date: ____________ Start time: ____________ Stop time: ____________ Movements: ____________ Date: ____________ Start time: ____________ Stop time: ____________ Movements: ____________ Date: ____________ Start time: ____________ Stop time: ____________ Movements: ____________ This information is not intended to replace advice given to you by your health care provider. Make sure you discuss any questions you have with your health care provider. Document Revised: 05/09/2019 Document Reviewed: 05/09/2019 Elsevier Patient Education  2020 [employer]  

## 2020-01-30 NOTE — MAU Provider Note (Signed)
Patient Carolyn Garner is a 34 y.o. G2P1001 at [redacted]w[redacted]d here for follow-up US from Dr. Lawana Chambers office. She denies vaginal bleeding, vaginal discharge, LOF, decreased fetal movements.   Patient is here for limited US due to measuring S<D today at her office visit and provider did not have access to outpatient [place] (per phone call with Dr. Charlotta Newton).   US shows AFI of 12 on preliminary [place]; confirmed with Dr. Macon Large that patient is safe to go after reviewing [place] reports.   NST: 130 bpm, mod var, present acel, neg decels, uterine irratability with occasional accelerations.   1. Follow-up for low amniotic fluid volume, antenatal, third trimester, fetus 1    -Patient stable for discharge with recommendation to follow up with Dr. Charlotta Newton by phone call this afternoon.  -Return to MAU if any ob/third trimester concerning signs or symptoms.  -All questions answered.    Charlesetta Garibaldi Jelan Batterton 01/30/2020, 10:59 AM

## 2020-01-31 MED FILL — FLUTICASONE PROP 50 MCG SPR: 50 | 30 days supply | Qty: 16 | Fill #0

## 2020-02-04 ENCOUNTER — Telehealth (HOSPITAL_COMMUNITY): Payer: Self-pay | Admitting: *Deleted

## 2020-02-04 NOTE — Telephone Encounter (Signed)
Preadmission screen  

## 2020-02-04 NOTE — Patient Instructions (Signed)
Carolyn Garner  02/04/2020   Your procedure is scheduled on:  02/19/2020  Arrive at 0830 at [employer] [facility] on [address] at [facility]  and [employer]. You are invited to use the FREE valet parking or use the Visitor's parking deck.  Pick up the phone at the desk and dial [phone].  Call this number if you have problems the morning of surgery: [phone]  Remember:   Do not eat food:(After Midnight) Desps de medianoche.  Do not drink clear liquids: (After Midnight) Desps de medianoche.  Take these medicines the morning of surgery with A SIP OF WATER:  none   Do not wear jewelry, make-up or nail polish.  Do not wear lotions, powders, or perfumes. Do not wear deodorant.  Do not shave 48 hours prior to surgery.  Do not bring valuables to the hospital.  [facility] is not   responsible for any belongings or valuables brought to the hospital.  Contacts, dentures or bridgework may not be worn into surgery.  Leave suitcase in the car. After surgery it may be brought to your room.  For patients admitted to the hospital, checkout time is 11:00 AM the day of              discharge.      Please read over the following fact sheets that you were given:     Preparing for Surgery

## 2020-02-05 ENCOUNTER — Encounter (HOSPITAL_COMMUNITY): Payer: Self-pay

## 2020-02-12 ENCOUNTER — Inpatient Hospital Stay (HOSPITAL_COMMUNITY): Payer: No Typology Code available for payment source | Admitting: Anesthesiology

## 2020-02-12 ENCOUNTER — Inpatient Hospital Stay (HOSPITAL_COMMUNITY)
Admission: AD | Admit: 2020-02-12 | Discharge: 2020-02-14 | DRG: 807 | Disposition: A | Discharge status: Home / Self Care | Payer: No Typology Code available for payment source | Attending: Obstetrics and Gynecology | Admitting: Obstetrics and Gynecology

## 2020-02-12 ENCOUNTER — Encounter (HOSPITAL_COMMUNITY): Payer: Self-pay | Admitting: Obstetrics and Gynecology

## 2020-02-12 ENCOUNTER — Other Ambulatory Visit: Payer: Self-pay

## 2020-02-12 DIAGNOSIS — O34219 Maternal care for unspecified type scar from previous cesarean delivery: Principal | ICD-10-CM | POA: Diagnosis not present

## 2020-02-12 DIAGNOSIS — Z20822 Contact with and (suspected) exposure to covid-19: Secondary | ICD-10-CM | POA: Diagnosis present

## 2020-02-12 DIAGNOSIS — O26893 Other specified pregnancy related conditions, third trimester: Secondary | ICD-10-CM | POA: Diagnosis present

## 2020-02-12 DIAGNOSIS — O99824 Streptococcus B carrier state complicating childbirth: Secondary | ICD-10-CM | POA: Diagnosis present

## 2020-02-12 DIAGNOSIS — Z3A40 40 weeks gestation of pregnancy: Secondary | ICD-10-CM

## 2020-02-12 LAB — TYPE AND SCREEN
ABO/RH(D): AB POS
Antibody Screen: NEGATIVE

## 2020-02-12 LAB — CBC
HCT: 41.6 % (ref 36.0–46.0)
Hemoglobin: 14.1 g/dL (ref 12.0–15.0)
MCH: 31.5 pg (ref 26.0–34.0)
MCHC: 33.9 g/dL (ref 30.0–36.0)
MCV: 92.9 fL (ref 80.0–100.0)
Platelets: 229 10*3/uL (ref 150–400)
RBC: 4.48 MIL/uL (ref 3.87–5.11)
RDW: 12.6 % (ref 11.5–15.5)
WBC: 12.9 10*3/uL — ABNORMAL HIGH (ref 4.0–10.5)
nRBC: 0 % (ref 0.0–0.2)

## 2020-02-12 LAB — SARS CORONAVIRUS 2 BY RT PCR (HOSPITAL ORDER, PERFORMED IN ~~LOC~~ HOSPITAL LAB): SARS Coronavirus 2: NEGATIVE

## 2020-02-12 LAB — RPR: RPR Ser Ql: NONREACTIVE

## 2020-02-12 LAB — ABO/RH: ABO/RH(D): AB POS

## 2020-02-12 MED ORDER — FENTANYL-BUPIVACAINE-NACL 0.5-0.125-0.9 MG/250ML-% EP SOLN
EPIDURAL | Status: AC
  Filled 2020-02-12: qty 250

## 2020-02-12 MED ORDER — ONDANSETRON HCL 4 MG/2ML IJ SOLN: 4.0000 mg | INTRAMUSCULAR | Status: DC | PRN

## 2020-02-12 MED ORDER — DIBUCAINE (PERIANAL) 1 % EX OINT: 1.0000 "application " | TOPICAL_OINTMENT | CUTANEOUS | Status: DC | PRN

## 2020-02-12 MED ORDER — ONDANSETRON HCL 4 MG PO TABS: 4.0000 mg | ORAL_TABLET | ORAL | Status: DC | PRN

## 2020-02-12 MED ORDER — LIDOCAINE HCL (PF) 1 % IJ SOLN
INTRAMUSCULAR | Status: DC | PRN
  Administered 2020-02-12 (×2): 4 mL via EPIDURAL

## 2020-02-12 MED ORDER — SOD CITRATE-CITRIC ACID 500-334 MG/5ML PO SOLN: 30.0000 mL | ORAL | Status: DC | PRN

## 2020-02-12 MED ORDER — PHENYLEPHRINE 40 MCG/ML (10ML) SYRINGE FOR IV PUSH (FOR BLOOD PRESSURE SUPPORT): 80.0000 ug | PREFILLED_SYRINGE | INTRAVENOUS | Status: DC | PRN

## 2020-02-12 MED ORDER — WITCH HAZEL-GLYCERIN EX PADS: 1.0000 "application " | MEDICATED_PAD | CUTANEOUS | Status: DC | PRN

## 2020-02-12 MED ORDER — SIMETHICONE 80 MG PO CHEW: 80.0000 mg | CHEWABLE_TABLET | ORAL | Status: DC | PRN

## 2020-02-12 MED ORDER — DOCUSATE SODIUM 100 MG PO CAPS
100.0000 mg | ORAL_CAPSULE | Freq: Two times a day (BID) | ORAL | Status: DC
  Administered 2020-02-12 – 2020-02-13 (×3): 100 mg via ORAL
  Filled 2020-02-12 (×3): qty 1

## 2020-02-12 MED ORDER — SODIUM CHLORIDE 0.9 % IV SOLN
2.0000 g | Freq: Four times a day (QID) | INTRAVENOUS | Status: DC
  Administered 2020-02-12: 2 g via INTRAVENOUS
  Filled 2020-02-12 (×3): qty 2000

## 2020-02-12 MED ORDER — LIDOCAINE HCL (PF) 1 % IJ SOLN
30.0000 mL | INTRAMUSCULAR | Status: AC | PRN
  Administered 2020-02-12: 30 mL via SUBCUTANEOUS
  Filled 2020-02-12: qty 30

## 2020-02-12 MED ORDER — LACTATED RINGERS IV SOLN
500.0000 mL | Freq: Once | INTRAVENOUS | Status: AC
  Administered 2020-02-12: 500 mL via INTRAVENOUS

## 2020-02-12 MED ORDER — LACTATED RINGERS IV SOLN: 500.0000 mL | Freq: Once | INTRAVENOUS | Status: DC

## 2020-02-12 MED ORDER — ACETAMINOPHEN 325 MG PO TABS: 650.0000 mg | ORAL_TABLET | ORAL | Status: DC | PRN

## 2020-02-12 MED ORDER — OXYTOCIN BOLUS FROM INFUSION
500.0000 mL | Freq: Once | INTRAVENOUS | Status: AC
  Administered 2020-02-12: 500 mL via INTRAVENOUS

## 2020-02-12 MED ORDER — EPHEDRINE 5 MG/ML INJ: 10.0000 mg | INTRAVENOUS | Status: DC | PRN

## 2020-02-12 MED ORDER — DIPHENHYDRAMINE HCL 25 MG PO CAPS: 25.0000 mg | ORAL_CAPSULE | Freq: Four times a day (QID) | ORAL | Status: DC | PRN

## 2020-02-12 MED ORDER — DIPHENHYDRAMINE HCL 50 MG/ML IJ SOLN: 12.5000 mg | INTRAMUSCULAR | Status: DC | PRN

## 2020-02-12 MED ORDER — COCONUT OIL OIL: 1.0000 "application " | TOPICAL_OIL | Status: DC | PRN

## 2020-02-12 MED ORDER — IBUPROFEN 600 MG PO TABS
600.0000 mg | ORAL_TABLET | Freq: Four times a day (QID) | ORAL | Status: DC
  Administered 2020-02-12 – 2020-02-14 (×8): 600 mg via ORAL
  Filled 2020-02-12 (×8): qty 1

## 2020-02-12 MED ORDER — ONDANSETRON HCL 4 MG/2ML IJ SOLN
4.0000 mg | Freq: Four times a day (QID) | INTRAMUSCULAR | Status: DC | PRN
  Administered 2020-02-12: 4 mg via INTRAVENOUS
  Filled 2020-02-12: qty 2

## 2020-02-12 MED ORDER — OXYCODONE HCL 5 MG PO TABS: 5.0000 mg | ORAL_TABLET | Freq: Four times a day (QID) | ORAL | Status: DC | PRN

## 2020-02-12 MED ORDER — ZOLPIDEM TARTRATE 5 MG PO TABS: 5.0000 mg | ORAL_TABLET | Freq: Every evening | ORAL | Status: DC | PRN

## 2020-02-12 MED ORDER — BENZOCAINE-MENTHOL 20-0.5 % EX AERO
1.0000 "application " | INHALATION_SPRAY | CUTANEOUS | Status: DC | PRN
  Filled 2020-02-12: qty 56

## 2020-02-12 MED ORDER — SODIUM CHLORIDE (PF) 0.9 % IJ SOLN
INTRAMUSCULAR | Status: DC | PRN
  Administered 2020-02-12: 12 mL/h via EPIDURAL

## 2020-02-12 MED ORDER — FENTANYL CITRATE (PF) 100 MCG/2ML IJ SOLN: 50.0000 ug | INTRAMUSCULAR | Status: DC | PRN

## 2020-02-12 MED ORDER — OXYTOCIN 40 UNITS IN NORMAL SALINE INFUSION - SIMPLE MED
2.5000 [IU]/h | INTRAVENOUS | Status: DC
  Filled 2020-02-12: qty 1000

## 2020-02-12 MED ORDER — LACTATED RINGERS IV SOLN: 500.0000 mL | INTRAVENOUS | Status: DC | PRN

## 2020-02-12 MED ORDER — LACTATED RINGERS IV SOLN: INTRAVENOUS | Status: DC

## 2020-02-12 MED ORDER — FENTANYL-BUPIVACAINE-NACL 0.5-0.125-0.9 MG/250ML-% EP SOLN: 12.0000 mL/h | EPIDURAL | Status: DC | PRN

## 2020-02-12 MED ORDER — PRENATAL MULTIVITAMIN CH
1.0000 | ORAL_TABLET | Freq: Every day | ORAL | Status: DC
  Administered 2020-02-12 – 2020-02-13 (×2): 1 via ORAL
  Filled 2020-02-12 (×2): qty 1

## 2020-02-12 NOTE — Anesthesia Procedure Notes (Signed)
Epidural Patient location during procedure: OB Start time: 02/12/2020 5:53 AM End time: 02/12/2020 5:56 AM  Staffing Anesthesiologist: Kaylyn Layer, MD Performed: anesthesiologist   Preanesthetic Checklist Completed: patient identified, IV checked, risks and benefits discussed, monitors and equipment checked, pre-op evaluation and timeout performed  Epidural Patient position: sitting Prep: DuraPrep and site prepped and draped Patient monitoring: continuous pulse ox, blood pressure and heart rate Approach: midline Location: L3-L4 Injection technique: LOR air  Needle:  Needle type: Tuohy  Needle gauge: 17 G Needle length: 9 cm Needle insertion depth: 7 cm Catheter type: closed end flexible Catheter size: 19 Gauge Catheter at skin depth: 11 cm Test dose: negative and Other (1% lidocaine)  Assessment Events: blood not aspirated, injection not painful, no injection resistance, no paresthesia and negative IV test  Additional Notes Patient identified. Risks, benefits, and alternatives discussed with patient including but not limited to bleeding, infection, nerve damage, paralysis, failed block, incomplete pain control, headache, blood pressure changes, nausea, vomiting, reactions to medication, itching, and postpartum back pain. Confirmed with bedside nurse the patient's most recent platelet count. Confirmed with patient that they are not currently taking any anticoagulation, have any bleeding history, or any family history of bleeding disorders. Patient expressed understanding and wished to proceed. All questions were answered. Sterile technique was used throughout the entire procedure. Please see nursing notes for vital signs.   Crisp LOR on first pass. Test dose was given through epidural catheter and negative prior to continuing to dose epidural or start infusion. Warning signs of high block given to the patient including shortness of breath, tingling/numbness in hands, complete  motor block, or any concerning symptoms with instructions to call for help. Patient was given instructions on fall risk and not to get out of bed. All questions and concerns addressed with instructions to call with any issues or inadequate analgesia.  Reason for block:procedure for pain

## 2020-02-12 NOTE — H&P (Signed)
OB ADMISSION/ HISTORY & PHYSICAL:  Admission Date: 02/12/2020  4:44 AM  Admit Diagnosis: Active labor  Carolyn Garner is a 34 y.o. female G2P1001 [redacted]w[redacted]d presenting for ctx. Endorses active FM, denies LOF and vaginal bleeding. Ctx began @ midnight and increased in frequency and intensity @ 0500. Primary C/S after failed vacuum for OP presentation. Pt desires TOLAC.   History of current pregnancy: G2P1001   Primary Ob Provider: Ozan Patient entered care with [facility] at 10+0 wks.   EDC of 02/12/20 was established by 1st trimester [place]  Significant prenatal events: none Patient Active Problem List   Diagnosis Date Noted  . VBAC, delivered 5/12 02/12/2020    Priority: Medium  . Cesarean delivery delivered 06/09/2018  . Normal labor 06/06/2018    Prenatal Labs: ABO, Rh: --/--/AB POS (05/12 0520) Antibody: NEG (05/12 0520) Rubella: Immune (10/01 0000)  RPR: Nonreactive (10/01 0000)  HBsAg: Negative (10/01 0000)  HIV: Non-reactive (10/01 0000)  GTT: passed 1 hr GBS: Positive/-- (10/01 0000)  GC/CHL: neg/neg   OB History  Gravida Para Term Preterm AB Living  2 1 1     1   SAB TAB Ectopic Multiple Live Births        0 1    # Outcome Date GA Lbr Len/2nd Weight Sex Delivery Anes PTL Lv  2 Current           1 Term 06/06/18 [redacted]w[redacted]d 08:39 / 04:00 2905 g F CS-LTranv EPI  LIV    Medical / Surgical History: Past medical history:  Past Medical History:  Diagnosis Date  . Medical history non-contributory     Past surgical history:  Past Surgical History:  Procedure Laterality Date  . CESAREAN SECTION N/A 06/06/2018   Procedure: CESAREAN SECTION;  Surgeon: Janyth Pupa, DO;  Location: Normal;  Service: Obstetrics;  Laterality: N/A;  . WISDOM TOOTH EXTRACTION     Family History:  Family History  Problem Relation Age of Onset  . Lung disease Father   . Polymyositis Father   . Ulcerative colitis Sister     Social History:  reports that she has never smoked. She has  never used smokeless tobacco. She reports previous alcohol use. She reports that she does not use drugs.  Allergies: Patient has no known allergies.   Current Medications at time of admission:  Prior to Admission medications   Medication Sig Start Date End Date Taking? Authorizing Provider  Prenatal Vit-Fe Fumarate-FA (PRENATAL VITAMIN PO) Take 1 tablet by mouth every evening.    Yes [provider]    Review of Systems: Constitutional: Negative   HENT: Negative   Eyes: Negative   Respiratory: Negative   Cardiovascular: Negative   Gastrointestinal: Negative  Genitourinary: positive for bloody show, negative for LOF   Musculoskeletal: Negative   Skin: Negative   Neurological: Negative   Endo/Heme/Allergies: Negative   Psychiatric/Behavioral: Negative    Physical Exam: VS: Blood pressure (!) 135/94, pulse 99, temperature 98.4 F (36.9 C), resp. rate 18, last menstrual period 05/08/2019, SpO2 98 %, unknown if currently breastfeeding. AAO x3, no signs of distress Cardiovascular: RRR GU/GI: Abdomen gravid, non-tender, non-distended, active FM, vertex, EFW 7# per Leopold's Extremities: trace edema, negative for pain, tenderness, and cords  Cervical exam:Dilation: 10 Effacement (%): 80 Station: -2 Exam by:: Violet Cart CNM FHR: baseline rate 120 / variability moderate / accelerations present / absent decelerations TOCO: ctx Q2 min, 60 sec, mod-strong   Prenatal Transfer Tool  Maternal Diabetes: No Genetic Screening:  Normal Maternal Ultrasounds/Referrals: Normal Fetal Ultrasounds or other Referrals:  None Maternal Substance Abuse:  No Significant Maternal Medications:  None Significant Maternal Lab Results: Group B Strep positive    Assessment: 34 y.o. G2P1001 [redacted]w[redacted]d  Active stage of labor FHR category 1 GBS positive Pain management plan: epidural   Plan:  Admit to L&D Routine admission orders Epidural PRN Ampicillin for GBS prophylaxis Dr Charlotta Newton notified of  admission and plan of care  Roma Schanz CNM, MSN 02/12/2020 6:56 AM

## 2020-02-12 NOTE — Lactation Note (Signed)
This note was copied from a baby's chart. Lactation Consultation Note  Patient Name: Carolyn Garner [id number] Date: 02/12/2020 Reason for consult: Initial assessment;Term;Difficult latch;Other (Comment)(areola edema bilaterally and right nipple has a white bleb ( per mom noticed it at [redacted] weeks pregnant ))  Baby is 8 hours old.  Per mom had a Difficult time latching her 1st baby  and ended up pumping for 6 months.  Has 2 DEBP 's at home. Mom mentioned 36 weeks she developed a bleb in her ( right nipple ),  Mom requested LC to assess. LC noted it to still be there, also areola edema .  1st applied moist warm burp cloth on the nipple for 5- 10 mins and we noticed it to decrease in size, but still present.  LC recommended prior to every latch until areolas more compressible - breast massage, hand express, pre-pump with hand pump to stretch the nipple / areola complex and then reverse pressure.  After LC changed a wet and stool diaper, placed baby STS and attempted to latch after recommended steps for the edematous areolas and baby not interested.  LC placed baby on moms chest STS.  LC instructed mom on the use of shells for between feedings except when sleeping .  And the hand pump. Mom able to operate the hand pump and the #24 F was a good comfortable fit,.  Mom able to do the recommended steps above without problems.  LC provided the [facility] pamphlet with phone numbers.  Mom aware latching may take some time.     Maternal Data Has patient been taught Hand Expression?: Yes(noted a small drop left nipple) Does the patient have breastfeeding experience prior to this delivery?: Yes  Feeding Feeding Type: Breast Fed  LATCH Score Latch: Too sleepy or reluctant, no latch achieved, no sucking elicited.  Audible Swallowing: None  Type of Nipple: Everted at rest and after stimulation(areola edema - indicating shells and pre pumping)  Comfort (Breast/Nipple): Engorged, cracked, bleeding, large  blisters, severe discomfort  Hold (Positioning): Assistance needed to correctly position infant at breast and maintain latch.  LATCH Score: 3  Interventions Interventions: Breast feeding basics reviewed;Assisted with latch;Skin to skin;Breast massage;Hand express;Pre-pump if needed;Reverse pressure;Adjust position;Support pillows;Position options  Lactation Tools Discussed/Used Tools: Shells;Pump Shell Type: Inverted Breast pump type: Manual WIC Program: No Pump Review: Setup, frequency, and cleaning;Milk Storage Initiated by:: MAI Date initiated:: 02/12/20   Consult Status Consult Status: Follow-up Date: 02/12/20 Follow-up type: In-patient    Matilde Sprang [name] 02/12/2020, 3:03 PM

## 2020-02-12 NOTE — MAU Note (Signed)
PT SAYS UC STRONG SINCE 0400. PNC WITH  OZAN -  2 CM .  DENIES HSV AND MRSA. GBS- POSITIVE

## 2020-02-12 NOTE — Anesthesia Postprocedure Evaluation (Signed)
Anesthesia Post Note  Patient: Carolyn Garner  Procedure(s) Performed: AN AD HOC LABOR EPIDURAL     Patient location during evaluation: Mother Baby Anesthesia Type: Epidural Level of consciousness: awake and alert and oriented Pain management: satisfactory to patient Vital Signs Assessment: post-procedure vital signs reviewed and stable Respiratory status: respiratory function stable Cardiovascular status: stable Postop Assessment: no headache, no backache, epidural receding, patient able to bend at knees, no signs of nausea or vomiting, adequate PO intake, no apparent nausea or vomiting and able to ambulate Anesthetic complications: no    Last Vitals:  Vitals:   02/12/20 0905 02/12/20 1000  BP: 111/85 112/75  Pulse: 83 97  Resp: 17 18  Temp: 36.8 C 37 C  SpO2: 98% 96%    Last Pain:  Vitals:   02/12/20 1203  TempSrc:   PainSc: 4    Pain Goal:                   Carmello Cabiness

## 2020-02-12 NOTE — Progress Notes (Signed)
Pt transferred to L&D for delivery.

## 2020-02-12 NOTE — Anesthesia Preprocedure Evaluation (Signed)
Anesthesia Evaluation  Patient identified by MRN, date of birth, ID band Patient awake    Reviewed: Allergy & Precautions, Patient's Chart, lab work & pertinent test results  History of Anesthesia Complications Negative for: history of anesthetic complications  Airway Mallampati: II  TM Distance: >3 FB Neck ROM: Full    Dental no notable dental hx.    Pulmonary neg pulmonary ROS,    Pulmonary exam normal        Cardiovascular negative cardio ROS Normal cardiovascular exam     Neuro/Psych negative neurological ROS  negative psych ROS   GI/Hepatic negative GI ROS, Neg liver ROS,   Endo/Other  negative endocrine ROS  Renal/GU negative Renal ROS  negative genitourinary   Musculoskeletal negative musculoskeletal ROS (+)   Abdominal   Peds  Hematology negative hematology ROS (+)   Anesthesia Other Findings Day of surgery medications reviewed with patient.  Reproductive/Obstetrics (+) Pregnancy (Hx of C/S x1)                             Anesthesia Physical Anesthesia Plan  ASA: III  Anesthesia Plan: Epidural   Post-op Pain Management:    Induction:   PONV Risk Score and Plan: Treatment may vary due to age or medical condition  Airway Management Planned: Natural Airway  Additional Equipment:   Intra-op Plan:   Post-operative Plan:   Informed Consent: I have reviewed the patients History and Physical, chart, labs and discussed the procedure including the risks, benefits and alternatives for the proposed anesthesia with the patient or authorized representative who has indicated his/her understanding and acceptance.       Plan Discussed with:   Anesthesia Plan Comments:        Anesthesia Quick Evaluation  

## 2020-02-13 LAB — CBC
HCT: 32.6 % — ABNORMAL LOW (ref 36.0–46.0)
Hemoglobin: 10.9 g/dL — ABNORMAL LOW (ref 12.0–15.0)
MCH: 31.6 pg (ref 26.0–34.0)
MCHC: 33.4 g/dL (ref 30.0–36.0)
MCV: 94.5 fL (ref 80.0–100.0)
Platelets: 191 10*3/uL (ref 150–400)
RBC: 3.45 MIL/uL — ABNORMAL LOW (ref 3.87–5.11)
RDW: 13 % (ref 11.5–15.5)
WBC: 9.5 10*3/uL (ref 4.0–10.5)
nRBC: 0 % (ref 0.0–0.2)

## 2020-02-13 NOTE — Lactation Note (Signed)
This note was copied from a baby's chart. Lactation Consultation Note  Patient Name: Carolyn Garner [id number] Date: 02/13/2020  P2, 21 hour term female infant. LC entered the room, mom and infant asleep at this time.   Maternal Data    Feeding Feeding Type: Breast Fed  LATCH Score                   Interventions    Lactation Tools Discussed/Used Tools: Nipple Shields Nipple shield size: 16 Shell Type: Inverted Breast pump type: Manual   Consult Status      Carolyn Garner 02/13/2020, 4:09 AM

## 2020-02-13 NOTE — Progress Notes (Signed)
Postpartum Note Day # 1  S:  Patient resting comfortable in bed.  Pain controlled.  Tolerating general diet. + flatus, no BM.  Lochia moderate.  Ambulating without difficulty.  She denies n/v/f/c, SOB, or CP.  Pt plans on breastfeeding.  O: Temp:  [97.7 F (36.5 C)-98.7 F (37.1 C)] 97.8 F (36.6 C) (05/13 0631) Pulse Rate:  [71-97] 71 (05/13 0631) Resp:  [16-20] 18 (05/13 0631) BP: (92-127)/(59-85) 92/59 (05/13 0631) SpO2:  [96 %-99 %] 97 % (05/13 0631)   Gen: A&Ox3, NAD CV: RRR Resp: CTAB Abdomen: soft, NT, ND Uterus: firm, non-tender, below umbilicus Ext: No edema, no calf tenderness bilaterally  Labs:  Recent Labs    02/12/20 0522 02/13/20 0540  HGB 14.1 10.9*    A/P: Pt is a 34 y.o. [id number] s/p VBAC, PPD#1  - Pain well controlled -GU: voiding freely -GI: Tolerating general diet -Activity: encouraged sitting up to chair and ambulation as tolerated -Prophylaxis: early amblation -Labs: appropriate decline for recent delivery -Baby boy circ to be completed later today  DISPO: Continue with routine postpartum care  Kearra Calkin, DO [phone] (cell) [phone] (office)

## 2020-02-13 NOTE — Lactation Note (Signed)
This note was copied from a baby's chart. Lactation Consultation Note  Patient Name: Carolyn Garner [id number] Date: 02/13/2020  [name] has had a 6 percent weight loss and has had and increase in bilirubin. [name] now 33 hours old. Discussed with mom initiiating pumping with DEBP.  Mom has questions about flange fit.  Reports different people have fitted her with different flanges. Measured moms nipples.  Left nipple 70mm and right nipple 16 mm. Discussed 24mm flanges a good fit for now but that with pumping and breastfeeding nipples can enlarge, may need 24 flanges later.   Infant with pacifier on arrival in crib.  Urged mom to get rid of pacifier until bf well. Took pacifier out and infant rooting and crying.  Mom reports she just tried to feed him and he didn't feed long.  Assisted with hand expression.  Mom able to get some drops and fed him via spoon.  Infant rooting and cuing.  Assisted with trying to attempt to latch him.  He gets there and just holds nipple in mouth. Mom with bleb on right nipple.  Discussed using wash cloth and gently rubbing it and adding moist heat.  Mom has recently put nipple cream on.  Discussed the use of nipple cream and to just put a small amount on untill time to breastfeed again.Left mom doing some hand expression and spoon feeding.  Hen[place] sibling was jaundiced. Asked mom to call me when ready to try and feed again and initiate pumping.  Mom agreed.  Urged to call lactation as needed.   Maternal Data    Feeding Feeding Type: Breast Milk  LATCH Score                   Interventions    Lactation Tools Discussed/Used     Consult Status      Jeidy Hoerner Michaelle Copas 02/13/2020, 7:40 PM

## 2020-02-13 NOTE — Lactation Note (Addendum)
This note was copied from a baby's chart. Lactation Consultation Note  Patient Name: Carolyn Garner [id number] Date: 02/13/2020 Reason for consult: Follow-up assessment;Difficult latch Mom reports recently tried to feed him again and he did not latch.  Inititiated pumping with DEBP with mom.  Pumped on the initiate cycle for 15 minutes with 21 mm flanges. A few drops in flanges.  Mom fed it to him on her finger.   Assist with latch.  He is fussy and comes off and on.  Apply 20 mm nipple shield.  Infant latched and breastfed well with a few audible swallows noted.  He was very chompy at first but got more rhythmic as the feeding went on.  He fell asleep on right breast and then moved to the left.  Unable to get mom comfortable on that side in football.  She continuosly reported pinching. After many attempts Moved him to cross cradle hold on left and mom reports comfort.  There was spit and a small amount of colostrum in nipple shield when he came off of right breast.  Discussed mom continuing to wear shells, massage and prepump and add post pumping due to increase in bili and using nipple shield.  PM lactation Consultant to follow up with mom.    LATCH Score                   Interventions Interventions: Assisted with latch;Hand express;Adjust position;Support pillows;Position options;DEBP  Lactation Tools Discussed/Used Tools: Nipple Shields Nipple shield size: 20 Breast pump type: Double-Electric Breast Pump;Manual   Consult Status Consult Status: Follow-up Date: 02/13/20 Follow-up type: In-patient    [name] Michaelle Copas 02/13/2020, 7:59 PM

## 2020-02-14 MED ORDER — IBUPROFEN 600 MG PO TABS: 600.0000 mg | ORAL_TABLET | Freq: Four times a day (QID) | ORAL | 0 refills | Status: DC

## 2020-02-14 MED ORDER — DOCUSATE SODIUM 100 MG PO CAPS: 100.0000 mg | ORAL_CAPSULE | Freq: Two times a day (BID) | ORAL | 0 refills | Status: DC

## 2020-02-14 NOTE — Discharge Instructions (Signed)

## 2020-02-14 NOTE — Lactation Note (Signed)
This note was copied from a baby's chart. Lactation Consultation Note Attempted to see mom. Mom stated she BF 1 hr ago. Mom will call for [name] for next feeding.  Patient Name: Carolyn Garner [id number] Date: 02/14/2020     Maternal Data    Feeding Feeding Type: Breast Fed  LATCH Score Latch: Repeated attempts needed to sustain latch, nipple held in mouth throughout feeding, stimulation needed to elicit sucking reflex.  Audible Swallowing: A few with stimulation  Type of Nipple: Everted at rest and after stimulation  Comfort (Breast/Nipple): Filling, red/small blisters or bruises, mild/mod discomfort  Hold (Positioning): Assistance needed to correctly position infant at breast and maintain latch.  LATCH Score: 6  Interventions    Lactation Tools Discussed/Used Tools: Nipple Shields Nipple shield size: 20   Consult Status      Carolyn Garner 02/14/2020, 12:41 AM

## 2020-02-14 NOTE — Lactation Note (Signed)
This note was copied from a baby's chart. Lactation Consultation Note  Patient Name: Carolyn Garner [id number] Date: 02/14/2020   Infant is 50 hrs old & parents/infant are planning on going home today. The last 2 feedings have only been 5 minutes long.   Mom allowed me to do a breast exam. Mom reports low milk supply with her 1st child. Mom has excellent veining on her breasts & Mom reports that her breasts feel heavier to her today. When doing hand expression, colostrum was expressed. A nipple bleb on the R nipple was noted (as charted by the previous LCs). With this being Mom's 2nd baby, I anticipate that she will have more milk once her milk comes to volume.  Mom allowed me to observe infant at breast. We attempted to latch infant to the bare breast, but infant would only latch with a nipple shield (size 20). Infant would not always rhythmically suckle. Mom reports that he falls asleep or needs stimulation at the breast. Mom provided breast massage/compression during the feeding.  The swallows verified by cervical auscultation did not reflect adequate transfer. I suggested supplementing at the breast to keep infant engaged, but parents were not interested in doing so b/c of their experience with their previous child. Parents prefer to give supplement with a bottle. I provided parents with the reference sheet that has guidelines on feeding volumes with an emphasis to feed infant until comfortably full. I asked parents if they wanted to give some formula while baby was here, but they declined, saying that Mom was going to pump next & they had formula at home.   Parents report that this infant appears more yellow today than yesterday. Their 1st infant was under phototherapy for 3 days.   Extra Comfort Gels were provided per maternal request. I educated on rinsing the Comfort Gels after use and placing them in their plastic "sleeves." I also educated on manufacturers' recommendation to use a moist  washcloth to remove any residue before putting infant to the breast or pumping.   Mom has a Spectra pump & an Elvie pump at home.   After I left the room, the nurse called to tell me that the infant had come off the breast and the size 20 nipple shield appeared to tight. I went into room & verified that observation & returned with a size 24 nipple shield. Mom affirmed that the size 21 flanges were comfortable yesterday, but knows that she has size 24 flanges available to her, as well.    Lurline Hare [facility] 02/14/2020, 9:27 AM

## 2020-02-14 NOTE — Discharge Summary (Signed)
Postpartum Discharge Summary    Patient Name: Carolyn Garner DOB: 1986-01-09 MRN: [id number]  Date of admission: 02/12/2020 Delivery date:02/12/2020  Delivering provider: Burman Garner B  Date of discharge: 02/14/2020  Admitting diagnosis: Normal labor [O80, Z37.9] Intrauterine pregnancy: [redacted]w[redacted]d    Secondary diagnosis:  Active Problems:   Normal labor   VBAC, delivered 5/12  Additional problems: none    Discharge diagnosis: Term Pregnancy Delivered                                              Post partum procedures:none Augmentation: N/A Complications: None  Hospital course: Onset of Labor With Vaginal Delivery      34y.o. yo G2P2002 at 472w0das admitted in Active Labor on 02/12/2020. Patient had an uncomplicated labor course as follows:  Membrane Rupture Time/Date: 6:33 AM ,02/12/2020   Delivery Method:VBAC, Spontaneous  Episiotomy: None  Lacerations:  2nd degree  Patient had an uncomplicated postpartum course.  She is ambulating, tolerating a regular diet, passing flatus, and urinating well. Patient is discharged home in stable condition on 02/14/20.  Newborn Data: Birth date:02/12/2020  Birth time:6:33 AM  Gender:Female  Living status:Living  Apgars:8 ,9   Magnesium Sulfate received: No BMZ received: No Rhophylac:No MMR:N/A T-DaP:Given prenatally Flu: Yes Transfusion:No  Physical exam  Vitals:   02/13/20 1345 02/13/20 1548 02/13/20 2301 02/14/20 0559  BP: 99/63  98/62 111/73  Pulse: 72  82 70  Resp:  '18 18 18  ' Temp: 98.3 F (36.8 C)  98.1 F (36.7 C) 98.1 F (36.7 C)  TempSrc: Oral  Oral Oral  SpO2: 100%  97% 98%   General: alert, cooperative and no distress Lochia: appropriate Uterine Fundus: firm Incision: N/A DVT Evaluation: No evidence of DVT seen on physical exam. Labs: Lab Results  Component Value Date   WBC 9.5 02/13/2020   HGB 10.9 (L) 02/13/2020   HCT 32.6 (L) 02/13/2020   MCV 94.5 02/13/2020   PLT 191 02/13/2020   No flowsheet  data found. Edinburgh Score: Edinburgh Postnatal Depression Scale Screening Tool 02/12/2020  I have been able to laugh and see the funny side of things. 0  I have looked forward with enjoyment to things. 0  I have blamed myself unnecessarily when things went wrong. 0  I have been anxious or worried for no good reason. 2  I have felt scared or panicky for no good reason. 0  Things have been getting on top of me. 0  I have been so unhappy that I have had difficulty sleeping. 0  I have felt sad or miserable. 0  I have been so unhappy that I have been crying. 0  The thought of harming myself has occurred to me. 0  Edinburgh Postnatal Depression Scale Total 2      After visit meds:  Allergies as of 02/14/2020   No Known Allergies     Medication List    TAKE these medications   docusate sodium 100 MG capsule Commonly known as: COLACE Take 1 capsule (100 mg total) by mouth 2 (two) times daily.   ibuprofen 600 MG tablet Commonly known as: ADVIL Take 1 tablet (600 mg total) by mouth every 6 (six) hours.   PRENATAL VITAMIN PO Take 1 tablet by mouth every evening.        Discharge home in stable condition Infant  Feeding: Breast Infant Disposition:home with mother Discharge instruction: per After Visit Summary and Postpartum booklet. Activity: Advance as tolerated. Pelvic rest for 6 weeks.  Diet: routine diet Anticipated Birth Control: Unsure Postpartum Appointment:3 weeks Additional Postpartum F/U: n/a Future Appointments: Future Appointments  Date Time Provider [place]  02/17/2020  8:30 AM [facility]U 1 [facility]IN[facility] None   Follow up Visit: Follow-up Information    Carolyn Pupa, DO Follow up in 3 week(s).   Specialty: Obstetrics and Gynecology Contact information: [address] [place] [address] [phone]               02/14/2020 Carolyn Genta, DO

## 2020-02-14 NOTE — Lactation Note (Signed)
This note was copied from a baby's chart. Lactation Consultation Note  Patient Name: Carolyn Garner [id number] Date: 02/14/2020   After switching to a size 24 nipple shield, the feeding improved dramatically & Mom reported that it was the best feeding he had had since birth. Mom's breasts are filling & infant was able to soften the breast. I went back & talked to Mom & explained why the size 24 NS may have worked so well. I asked Mom to offer both breasts & to pump at least 4-6 times/day (b/c of nipple shield use) to remove any additional milk and give the EBM to the infant. Mom knows that formula can be used, if needed to keep infant content.   With Mom's permission, a request for an outpatient lactation appt was sent via Epic. An extra size 24 nipple shield was provided to Mom.   Lurline Hare [facility] 02/14/2020, 10:16 AM

## 2020-02-17 ENCOUNTER — Other Ambulatory Visit (HOSPITAL_COMMUNITY)
Admission: RE | Admit: 2020-02-17 | Discharge: 2020-02-17 | Disposition: A | Discharge status: Home / Self Care | Payer: No Typology Code available for payment source | Source: Ambulatory Visit | Attending: Obstetrics & Gynecology | Admitting: Obstetrics & Gynecology

## 2020-02-19 ENCOUNTER — Encounter (HOSPITAL_COMMUNITY): Admission: RE | Payer: Self-pay | Source: Home / Self Care

## 2020-02-19 ENCOUNTER — Inpatient Hospital Stay (HOSPITAL_COMMUNITY)
Admission: RE | Admit: 2020-02-19 | Payer: No Typology Code available for payment source | Source: Home / Self Care | Admitting: Obstetrics & Gynecology

## 2020-02-19 SURGERY — Surgical Case
Anesthesia: Regional

## 2020-03-26 MED FILL — PREMARIN VAGINAL CREAM-APPL: 0.625 | 30 days supply | Qty: 30 | Fill #0

## 2020-03-26 MED FILL — BLISOVI 24 FE 1-20 MG-MCG(2: 1-20 | 84 days supply | Qty: 84 | Fill #0

## 2021-04-29 ENCOUNTER — Other Ambulatory Visit (HOSPITAL_COMMUNITY): Payer: Self-pay

## 2021-04-29 MED ORDER — IBUPROFEN 600 MG PO TABS
600.0000 mg | ORAL_TABLET | Freq: Three times a day (TID) | ORAL | 0 refills | Status: DC
Start: 2021-04-29 — End: 2023-05-09
  Filled 2021-04-29: qty 30, 8d supply, fill #0

## 2021-04-29 MED ORDER — BENZONATATE 100 MG PO CAPS
100.0000 mg | ORAL_CAPSULE | Freq: Three times a day (TID) | ORAL | 0 refills | Status: DC
Start: 2021-04-29 — End: 2023-05-09
  Filled 2021-04-29: qty 30, 10d supply, fill #0

## 2023-05-03 ENCOUNTER — Ambulatory Visit: Payer: Self-pay | Admitting: Internal Medicine

## 2023-05-09 ENCOUNTER — Ambulatory Visit (INDEPENDENT_AMBULATORY_CARE_PROVIDER_SITE_OTHER): Payer: 59 | Admitting: Internal Medicine

## 2023-05-09 ENCOUNTER — Encounter: Payer: Self-pay | Admitting: Internal Medicine

## 2023-05-09 VITALS — BP 98/69 | HR 67 | Temp 97.7°F | Ht 62.0 in | Wt 126.8 lb

## 2023-05-09 DIAGNOSIS — Z1159 Encounter for screening for other viral diseases: Secondary | ICD-10-CM | POA: Diagnosis not present

## 2023-05-09 DIAGNOSIS — Z0000 Encounter for general adult medical examination without abnormal findings: Secondary | ICD-10-CM

## 2023-05-09 LAB — COMPREHENSIVE METABOLIC PANEL
ALT: 28 U/L (ref 0–35)
AST: 23 U/L (ref 0–37)
Albumin: 5 g/dL (ref 3.5–5.2)
Alkaline Phosphatase: 38 U/L — ABNORMAL LOW (ref 39–117)
BUN: 12 mg/dL (ref 6–23)
CO2: 27 mEq/L (ref 19–32)
Calcium: 10.2 mg/dL (ref 8.4–10.5)
Chloride: 99 mEq/L (ref 96–112)
Creatinine, Ser: 0.61 mg/dL (ref 0.40–1.20)
GFR: 114.07 mL/min (ref >60.00)
Glucose, Bld: 72 mg/dL (ref 70–99)
Potassium: 4.1 mEq/L (ref 3.5–5.1)
Sodium: 138 mEq/L (ref 135–145)
Total Bilirubin: 0.7 mg/dL (ref 0.2–1.2)
Total Protein: 8.2 g/dL (ref 6.0–8.3)

## 2023-05-09 LAB — CBC WITH DIFFERENTIAL/PLATELET
Basophils Absolute: 0 10*3/uL (ref 0.0–0.1)
Basophils Relative: 0.5 % (ref 0.0–3.0)
Eosinophils Absolute: 0.1 10*3/uL (ref 0.0–0.7)
Eosinophils Relative: 1.5 % (ref 0.0–5.0)
HCT: 44.6 % (ref 36.0–46.0)
Hemoglobin: 15 g/dL (ref 12.0–15.0)
Lymphocytes Relative: 28.6 % (ref 12.0–46.0)
Lymphs Abs: 1.7 10*3/uL (ref 0.7–4.0)
MCHC: 33.6 g/dL (ref 30.0–36.0)
MCV: 91 fl (ref 78.0–100.0)
Monocytes Absolute: 0.3 10*3/uL (ref 0.1–1.0)
Monocytes Relative: 4.6 % (ref 3.0–12.0)
Neutro Abs: 3.9 10*3/uL (ref 1.4–7.7)
Neutrophils Relative %: 64.8 % (ref 43.0–77.0)
Platelets: 327 10*3/uL (ref 150.0–400.0)
RBC: 4.9 Mil/uL (ref 3.87–5.11)
RDW: 12.2 % (ref 11.5–15.5)
WBC: 6 10*3/uL (ref 4.0–10.5)

## 2023-05-09 LAB — LIPID PANEL
Cholesterol: 176 mg/dL (ref 0–200)
HDL: 47.4 mg/dL (ref >39.00)
LDL Cholesterol: 115 mg/dL — ABNORMAL HIGH (ref 0–99)
NonHDL: 128.13
Total CHOL/HDL Ratio: 4
Triglycerides: 66 mg/dL (ref 0.0–149.0)
VLDL: 13.2 mg/dL (ref 0.0–40.0)

## 2023-05-09 LAB — VITAMIN D 25 HYDROXY (VIT D DEFICIENCY, FRACTURES): VITD: 27.66 ng/mL — ABNORMAL LOW (ref 30.00–100.00)

## 2023-05-09 LAB — VITAMIN B12: Vitamin B-12: 403 pg/mL (ref 211–911)

## 2023-05-09 LAB — TSH: TSH: 1.41 u[IU]/mL (ref 0.35–5.50)

## 2023-05-09 NOTE — Progress Notes (Signed)
Established Patient Office Visit     CC/Reason for Visit: Annual preventive exam, establish care  HPI: Carolyn Garner is a 37 y.o. female who is coming in today for the above mentioned reasons.  She has no past medical history of significance.  She is overdue for an eye exam, has routine dental care.  She is due to schedule follow-up with GYN.  No acute concerns or complaints.   Past Medical/Surgical History: Past Medical History:  Diagnosis Date   Medical history non-contributory     Past Surgical History:  Procedure Laterality Date   CESAREAN SECTION N/A 06/06/2018   Procedure: CESAREAN SECTION;  Surgeon: Myna Hidalgo, DO;  Location: [facility];  Service: Obstetrics;  Laterality: N/A;   WISDOM TOOTH EXTRACTION      Social History:  reports that she has never smoked. She has never used smokeless tobacco. She reports that she does not currently use alcohol. She reports that she does not use drugs.  Allergies: No Known Allergies  Family History:  Family History  Problem Relation Age of Onset   Lung disease Father    Polymyositis Father    Ulcerative colitis Sister     No current outpatient medications on file.  Review of Systems:  Negative unless indicated in HPI.   Physical Exam: Vitals:   05/09/23 0949  BP: 98/69  Pulse: 67  Temp: 97.7 F (36.5 C)  TempSrc: Oral  SpO2: 99%  Weight: 126 lb 12.8 oz (57.5 kg)  Height: 5\' 2"  (1.575 m)    Body mass index is 23.19 kg/m.   Physical Exam Vitals reviewed.  Constitutional:      General: She is not in acute distress.    Appearance: Normal appearance. She is not ill-appearing, toxic-appearing or diaphoretic.  HENT:     Head: Normocephalic.     Right Ear: Tympanic membrane, ear canal and external ear normal. There is no impacted cerumen.     Left Ear: Tympanic membrane, ear canal and external ear normal. There is no impacted cerumen.     Nose: Nose normal.     Mouth/Throat:     Mouth:  Mucous membranes are moist.     Pharynx: Oropharynx is clear. No oropharyngeal exudate or posterior oropharyngeal erythema.  Eyes:     General: No scleral icterus.       Right eye: No discharge.        Left eye: No discharge.     Conjunctiva/sclera: Conjunctivae normal.     Pupils: Pupils are equal, round, and reactive to light.  Neck:     Vascular: No carotid bruit.  Cardiovascular:     Rate and Rhythm: Normal rate and regular rhythm.     Pulses: Normal pulses.     Heart sounds: Normal heart sounds.  Pulmonary:     Effort: Pulmonary effort is normal. No respiratory distress.     Breath sounds: Normal breath sounds.  Abdominal:     General: Abdomen is flat. Bowel sounds are normal.     Palpations: Abdomen is soft.  Musculoskeletal:        General: Normal range of motion.     Cervical back: Normal range of motion.  Skin:    General: Skin is warm and dry.  Neurological:     General: No focal deficit present.     Mental Status: She is alert and oriented to person, place, and time. Mental status is at baseline.  Psychiatric:  Mood and Affect: Mood normal.        Behavior: Behavior normal.        Thought Content: Thought content normal.        Judgment: Judgment normal.     Flowsheet Row Office Visit from 05/09/2023 in [facility] [place]  PHQ-9 Total Score 0        Impression and Plan:  Encounter for preventive health examination -     Hepatitis C antibody; Future -     CBC with Differential/Platelet; Future -     Comprehensive metabolic panel; Future -     Lipid panel; Future -     TSH; Future -     Vitamin B12; Future  Encounter for hepatitis C screening test for low risk patient -     VITAMIN D 25 Hydroxy (Vit-D Deficiency, Fractures); Future   -Recommend routine eye and dental care. -Healthy lifestyle discussed in detail. -Labs to be updated today. -Prostate cancer screening: N/A Health Maintenance  Topic Date Due   Hepatitis  C Screening  Never done   DTaP/Tdap/Td vaccine (1 - Tdap) Never done   Pap Smear  10/19/2019   COVID-19 Vaccine (1 - 2023-24 season) Never done   Flu Shot  05/04/2023   HIV Screening  Completed   HPV Vaccine  Aged Out    -She will schedule follow-up with GYN.     Chaya Jan, MD [facility]

## 2023-05-10 ENCOUNTER — Encounter: Payer: Self-pay | Admitting: Internal Medicine

## 2023-05-10 ENCOUNTER — Other Ambulatory Visit (HOSPITAL_COMMUNITY): Payer: Self-pay

## 2023-05-10 ENCOUNTER — Other Ambulatory Visit: Payer: Self-pay | Admitting: Internal Medicine

## 2023-05-10 DIAGNOSIS — E559 Vitamin D deficiency, unspecified: Secondary | ICD-10-CM | POA: Insufficient documentation

## 2023-05-10 MED ORDER — VITAMIN D (ERGOCALCIFEROL) 1.25 MG (50000 UNIT) PO CAPS
50000.0000 [IU] | ORAL_CAPSULE | ORAL | 0 refills | Status: AC
  Filled 2023-05-10: qty 12, 84d supply, fill #0

## 2023-06-07 ENCOUNTER — Other Ambulatory Visit (HOSPITAL_COMMUNITY): Payer: Self-pay

## 2023-07-19 ENCOUNTER — Ambulatory Visit (INDEPENDENT_AMBULATORY_CARE_PROVIDER_SITE_OTHER): Payer: 59 | Admitting: Internal Medicine

## 2023-07-19 ENCOUNTER — Encounter: Payer: Self-pay | Admitting: Internal Medicine

## 2023-07-19 ENCOUNTER — Other Ambulatory Visit (HOSPITAL_BASED_OUTPATIENT_CLINIC_OR_DEPARTMENT_OTHER): Payer: Self-pay

## 2023-07-19 VITALS — BP 98/68 | HR 64 | Temp 98.4°F | Wt 128.3 lb

## 2023-07-19 DIAGNOSIS — R1013 Epigastric pain: Secondary | ICD-10-CM

## 2023-07-19 MED ORDER — PANTOPRAZOLE SODIUM 40 MG PO TBEC
40.0000 mg | DELAYED_RELEASE_TABLET | Freq: Every day | ORAL | 1 refills | Status: AC
  Filled 2023-07-19: qty 30, 30d supply, fill #0

## 2023-07-19 NOTE — Progress Notes (Signed)
     Established Patient Office Visit     CC/Reason for Visit: Abdominal pain  HPI: Carolyn Garner is a 37 y.o. female who is coming in today for the above mentioned reasons.  For the past couple weeks has been having epigastric abdominal pain and nausea after eating.  She took some milk of magnesia that did not seem to help.  No fever, no vomiting.  Past Medical/Surgical History: Past Medical History:  Diagnosis Date   Medical history non-contributory     Past Surgical History:  Procedure Laterality Date   CESAREAN SECTION N/A 06/06/2018   Procedure: CESAREAN SECTION;  Surgeon: Myna Hidalgo, DO;  Location: [facility];  Service: Obstetrics;  Laterality: N/A;   WISDOM TOOTH EXTRACTION      Social History:  reports that she has never smoked. She has never used smokeless tobacco. She reports that she does not currently use alcohol. She reports that she does not use drugs.  Allergies: No Known Allergies  Family History:  Family History  Problem Relation Age of Onset   Lung disease Father    Polymyositis Father    Ulcerative colitis Sister      Current Outpatient Medications:    pantoprazole (PROTONIX) 40 MG tablet, Take 1 tablet (40 mg total) by mouth daily., Disp: 30 tablet, Rfl: 1   Vitamin D, Ergocalciferol, (DRISDOL) 1.25 MG (50000 UNIT) CAPS capsule, Take 1 capsule (50,000 Units total) by mouth every 7 (seven) days for 12 doses., Disp: 12 capsule, Rfl: 0  Review of Systems:  Negative unless indicated in HPI.   Physical Exam: Vitals:   07/19/23 1552  BP: 98/68  Pulse: 64  Temp: 98.4 F (36.9 C)  TempSrc: Oral  SpO2: 100%  Weight: 128 lb 4.8 oz (58.2 kg)    Body mass index is 23.47 kg/m.   Physical Exam Abdominal:     General: Abdomen is flat. There is no distension.     Palpations: There is no hepatomegaly or splenomegaly.     Tenderness: There is no abdominal tenderness.      Impression and Plan:  Epigastric abdominal pain -      CBC with Differential/Platelet; Future -     Comprehensive metabolic panel; Future -     US ABDOMEN LIMITED RUQ (LIVER/GB); Future -     Pantoprazole Sodium; Take 1 tablet (40 mg total) by mouth daily.  Dispense: 30 tablet; Refill: 1  -Potential etiologies include gastritis, gallbladder.  Check labs, send for right upper quadrant ultrasound, empiric trial of PPI therapy.   Time spent:30 minutes reviewing chart, interviewing and examining patient and formulating plan of care.     Chaya Jan, MD [facility] at [facility]

## 2023-07-20 LAB — COMPREHENSIVE METABOLIC PANEL
ALT: 44 U/L — ABNORMAL HIGH (ref 0–35)
AST: 27 U/L (ref 0–37)
Albumin: 4.7 g/dL (ref 3.5–5.2)
Alkaline Phosphatase: 42 U/L (ref 39–117)
BUN: 13 mg/dL (ref 6–23)
CO2: 29 meq/L (ref 19–32)
Calcium: 9.7 mg/dL (ref 8.4–10.5)
Chloride: 101 meq/L (ref 96–112)
Creatinine, Ser: 0.61 mg/dL (ref 0.40–1.20)
GFR: 113.91 mL/min (ref >60.00)
Glucose, Bld: 86 mg/dL (ref 70–99)
Potassium: 4 meq/L (ref 3.5–5.1)
Sodium: 138 meq/L (ref 135–145)
Total Bilirubin: 0.3 mg/dL (ref 0.2–1.2)
Total Protein: 7.9 g/dL (ref 6.0–8.3)

## 2023-07-20 LAB — CBC WITH DIFFERENTIAL/PLATELET
Basophils Absolute: 0 10*3/uL (ref 0.0–0.1)
Basophils Relative: 0.5 % (ref 0.0–3.0)
Eosinophils Absolute: 0.2 10*3/uL (ref 0.0–0.7)
Eosinophils Relative: 3.2 % (ref 0.0–5.0)
HCT: 42.6 % (ref 36.0–46.0)
Hemoglobin: 14.4 g/dL (ref 12.0–15.0)
Lymphocytes Relative: 31.6 % (ref 12.0–46.0)
Lymphs Abs: 1.8 10*3/uL (ref 0.7–4.0)
MCHC: 33.9 g/dL (ref 30.0–36.0)
MCV: 91.2 fL (ref 78.0–100.0)
Monocytes Absolute: 0.3 10*3/uL (ref 0.1–1.0)
Monocytes Relative: 4.8 % (ref 3.0–12.0)
Neutro Abs: 3.3 10*3/uL (ref 1.4–7.7)
Neutrophils Relative %: 59.9 % (ref 43.0–77.0)
Platelets: 302 10*3/uL (ref 150.0–400.0)
RBC: 4.67 Mil/uL (ref 3.87–5.11)
RDW: 12 % (ref 11.5–15.5)
WBC: 5.6 10*3/uL (ref 4.0–10.5)

## 2023-07-23 ENCOUNTER — Ambulatory Visit (HOSPITAL_BASED_OUTPATIENT_CLINIC_OR_DEPARTMENT_OTHER)
Admission: RE | Admit: 2023-07-23 | Discharge: 2023-07-23 | Disposition: A | Discharge status: Home / Self Care | Payer: 59 | Source: Ambulatory Visit | Attending: Internal Medicine | Admitting: Internal Medicine

## 2023-07-23 DIAGNOSIS — R1013 Epigastric pain: Secondary | ICD-10-CM | POA: Diagnosis not present

## 2023-07-23 DIAGNOSIS — K824 Cholesterolosis of gallbladder: Secondary | ICD-10-CM | POA: Diagnosis not present

## 2023-07-23 DIAGNOSIS — R1011 Right upper quadrant pain: Secondary | ICD-10-CM | POA: Diagnosis not present

## 2023-08-01 ENCOUNTER — Other Ambulatory Visit (HOSPITAL_BASED_OUTPATIENT_CLINIC_OR_DEPARTMENT_OTHER): Payer: Self-pay

## 2023-08-01 MED ORDER — COMIRNATY 30 MCG/0.3ML IM SUSY
PREFILLED_SYRINGE | INTRAMUSCULAR | 0 refills | Status: AC
  Filled 2023-08-01: qty 0.3, 1d supply, fill #0

## 2023-11-20 ENCOUNTER — Encounter: Payer: Self-pay | Admitting: Internal Medicine

## 2023-11-21 ENCOUNTER — Other Ambulatory Visit (HOSPITAL_COMMUNITY): Payer: Self-pay

## 2023-11-21 ENCOUNTER — Other Ambulatory Visit: Payer: Self-pay | Admitting: Internal Medicine

## 2023-11-21 DIAGNOSIS — R110 Nausea: Secondary | ICD-10-CM

## 2023-11-21 MED ORDER — ONDANSETRON 4 MG PO TBDP
4.0000 mg | ORAL_TABLET | Freq: Three times a day (TID) | ORAL | 0 refills | Status: AC | PRN
  Filled 2023-11-21: qty 20, 7d supply, fill #0

## 2023-12-14 DIAGNOSIS — B070 Plantar wart: Secondary | ICD-10-CM | POA: Diagnosis not present

## 2024-04-10 ENCOUNTER — Other Ambulatory Visit (INDEPENDENT_AMBULATORY_CARE_PROVIDER_SITE_OTHER)

## 2024-04-10 ENCOUNTER — Other Ambulatory Visit: Payer: Self-pay | Admitting: Internal Medicine

## 2024-04-10 ENCOUNTER — Other Ambulatory Visit (HOSPITAL_COMMUNITY): Payer: Self-pay

## 2024-04-10 DIAGNOSIS — R300 Dysuria: Secondary | ICD-10-CM

## 2024-04-10 MED ORDER — SULFAMETHOXAZOLE-TRIMETHOPRIM 800-160 MG PO TABS
1.0000 | ORAL_TABLET | Freq: Two times a day (BID) | ORAL | 0 refills | Status: AC
  Filled 2024-04-10: qty 14, 7d supply, fill #0

## 2024-04-11 LAB — URINALYSIS, ROUTINE W REFLEX MICROSCOPIC
Bilirubin Urine: NEGATIVE
Ketones, ur: NEGATIVE
Nitrite: NEGATIVE
Specific Gravity, Urine: 1.02 (ref 1.000–1.030)
Total Protein, Urine: 100 — AB
Urine Glucose: NEGATIVE
Urobilinogen, UA: 0.2 (ref 0.0–1.0)
pH: 7 (ref 5.0–8.0)

## 2024-04-12 LAB — URINE CULTURE
MICRO NUMBER:: 16676812
SPECIMEN QUALITY:: ADEQUATE

## 2024-04-15 ENCOUNTER — Ambulatory Visit: Payer: Self-pay | Admitting: Internal Medicine
